# Patient Record
Sex: Male | Born: 1945
Health system: Southern US, Community
[De-identification: ages and names within clinical notes are randomized; demographics above are authoritative.]

## PROBLEM LIST (undated history)

## (undated) DIAGNOSIS — K449 Diaphragmatic hernia without obstruction or gangrene: Secondary | ICD-10-CM

## (undated) DIAGNOSIS — Z86010 Personal history of colonic polyps: Secondary | ICD-10-CM

## (undated) DIAGNOSIS — R319 Hematuria, unspecified: Secondary | ICD-10-CM

## (undated) DIAGNOSIS — H269 Unspecified cataract: Secondary | ICD-10-CM

## (undated) DIAGNOSIS — M1990 Unspecified osteoarthritis, unspecified site: Secondary | ICD-10-CM

## (undated) DIAGNOSIS — M4802 Spinal stenosis, cervical region: Secondary | ICD-10-CM

## (undated) HISTORY — PX: OTHER SURGICAL HISTORY: SHX169

## (undated) HISTORY — PX: COLONOSCOPY: SHX174

---

## 1980-02-28 HISTORY — PX: BACK SURGERY: SHX140

## 2009-01-18 LAB — HM COLONOSCOPY

## 2010-02-27 HISTORY — PX: FINGER SURGERY: SHX640

## 2011-01-31 ENCOUNTER — Encounter (HOSPITAL_COMMUNITY): Payer: Self-pay | Admitting: Pharmacy Technician

## 2011-02-10 ENCOUNTER — Encounter (HOSPITAL_COMMUNITY): Payer: Self-pay

## 2011-02-10 ENCOUNTER — Encounter (HOSPITAL_COMMUNITY)
Admission: RE | Admit: 2011-02-10 | Discharge: 2011-02-10 | Disposition: A | Discharge status: Home / Self Care | Payer: Medicare Other | Source: Ambulatory Visit | Attending: Neurological Surgery | Admitting: Neurological Surgery

## 2011-02-10 HISTORY — DX: Hematuria, unspecified: R31.9

## 2011-02-10 HISTORY — DX: Personal history of colonic polyps: Z86.010

## 2011-02-10 HISTORY — DX: Diaphragmatic hernia without obstruction or gangrene: K44.9

## 2011-02-10 HISTORY — DX: Spinal stenosis, cervical region: M48.02

## 2011-02-10 HISTORY — DX: Unspecified osteoarthritis, unspecified site: M19.90

## 2011-02-10 HISTORY — DX: Unspecified cataract: H26.9

## 2011-02-10 LAB — BASIC METABOLIC PANEL
BUN: 12 mg/dL (ref 6–23)
CO2: 30 mEq/L (ref 19–32)
Calcium: 9.4 mg/dL (ref 8.4–10.5)
Chloride: 106 mEq/L (ref 96–112)
Creatinine, Ser: 0.98 mg/dL (ref 0.50–1.35)
GFR calc Af Amer: >90 mL/min (ref >90)
GFR calc non Af Amer: 83 mL/min — ABNORMAL LOW (ref >90)
Glucose, Bld: 140 mg/dL — ABNORMAL HIGH (ref 70–99)
Potassium: 4.5 mEq/L (ref 3.5–5.1)
Sodium: 142 mEq/L (ref 135–145)

## 2011-02-10 LAB — CBC
HCT: 46.8 % (ref 39.0–52.0)
Hemoglobin: 16 g/dL (ref 13.0–17.0)
MCH: 31.6 pg (ref 26.0–34.0)
MCHC: 34.2 g/dL (ref 30.0–36.0)
MCV: 92.5 fL (ref 78.0–100.0)
Platelets: 201 10*3/uL (ref 150–400)
RBC: 5.06 MIL/uL (ref 4.22–5.81)
RDW: 13 % (ref 11.5–15.5)
WBC: 9.1 10*3/uL (ref 4.0–10.5)

## 2011-02-10 LAB — SURGICAL PCR SCREEN
MRSA, PCR: NEGATIVE
Staphylococcus aureus: NEGATIVE

## 2011-02-10 LAB — DIFFERENTIAL
Basophils Absolute: 0.1 10*3/uL (ref 0.0–0.1)
Basophils Relative: 1 % (ref 0–1)
Eosinophils Absolute: 0.3 10*3/uL (ref 0.0–0.7)
Eosinophils Relative: 3 % (ref 0–5)
Lymphocytes Relative: 26 % (ref 12–46)
Lymphs Abs: 2.3 10*3/uL (ref 0.7–4.0)
Monocytes Absolute: 0.6 10*3/uL (ref 0.1–1.0)
Monocytes Relative: 7 % (ref 3–12)
Neutro Abs: 5.8 10*3/uL (ref 1.7–7.7)
Neutrophils Relative %: 64 % (ref 43–77)

## 2011-02-10 LAB — APTT: aPTT: 25 seconds (ref 24–37)

## 2011-02-10 LAB — PROTIME-INR
INR: 0.99 (ref 0.00–1.49)
Prothrombin Time: 13.3 seconds (ref 11.6–15.2)

## 2011-02-10 NOTE — Progress Notes (Signed)
Also requested CXR from [facility]

## 2011-02-10 NOTE — Progress Notes (Signed)
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

## 2011-02-10 NOTE — Pre-Procedure Instructions (Signed)
20 Randy Jensen  02/10/2011   Your procedure is scheduled on: Thurs,Dec 20 @ 0730 Report to [facility] at 0530 AM.  Call this number if you have problems the morning of surgery: [phone]   Remember:   Do not eat food:After Midnight.  May have clear liquids: up to 4 Hours before arrival.(until 1:30am)  Clear liquids include soda, tea, black coffee, apple or grape juice, broth.  Take these medicines the morning of surgery with A SIP OF WATER: Pantoprazole   Do not wear jewelry, make-up or nail polish.  Do not wear lotions, powders, or perfumes. You may wear deodorant.  Do not shave 48 hours prior to surgery.  Do not bring valuables to the hospital.  Contacts, dentures or bridgework may not be worn into surgery.  Leave suitcase in the car. After surgery it may be brought to your room.  For patients admitted to the hospital, checkout time is 11:00 AM the day of discharge.   Patients discharged the day of surgery will not be allowed to drive home.  Name and phone number of your driver:   Special Instructions: CHG Shower Use Special Wash: 1/2 bottle night before surgery and 1/2 bottle morning of surgery.   Please read over the following fact sheets that you were given: Pain Booklet, Coughing and Deep Breathing, MRSA Information and Surgical Site Infection Prevention

## 2011-02-10 NOTE — Progress Notes (Signed)
Spoke with Dr.Allison Sniders office,ekg and cxr was done 07/2009;no record of stress/echo done  Called over to [facility] to see if they have a copy of these reports;stress test was found and to be sent;but no echo was done

## 2011-02-15 MED ORDER — SODIUM CHLORIDE 0.9 % IV SOLN
500.0000 mg | Freq: Once | INTRAVENOUS | Status: AC
  Administered 2011-02-16: 500 mg via INTRAVENOUS
  Filled 2011-02-15: qty 500

## 2011-02-16 ENCOUNTER — Inpatient Hospital Stay (HOSPITAL_COMMUNITY): Payer: Medicare Other

## 2011-02-16 ENCOUNTER — Encounter (HOSPITAL_COMMUNITY): Payer: Self-pay | Admitting: Anesthesiology

## 2011-02-16 ENCOUNTER — Inpatient Hospital Stay (HOSPITAL_COMMUNITY): Payer: Medicare Other | Admitting: Anesthesiology

## 2011-02-16 ENCOUNTER — Other Ambulatory Visit: Payer: Self-pay

## 2011-02-16 ENCOUNTER — Encounter (HOSPITAL_COMMUNITY): Payer: Self-pay | Admitting: Neurological Surgery

## 2011-02-16 ENCOUNTER — Inpatient Hospital Stay (HOSPITAL_COMMUNITY)
Admission: RE | Admit: 2011-02-16 | Discharge: 2011-02-17 | DRG: 473 | Disposition: A | Discharge status: Home / Self Care | Payer: Medicare Other | Source: Ambulatory Visit | Attending: Neurological Surgery | Admitting: Neurological Surgery

## 2011-02-16 ENCOUNTER — Encounter (HOSPITAL_COMMUNITY)
Admission: RE | Disposition: A | Discharge status: Home / Self Care | Payer: Self-pay | Source: Ambulatory Visit | Attending: Neurological Surgery

## 2011-02-16 DIAGNOSIS — Z860100 Personal history of colon polyps, unspecified: Secondary | ICD-10-CM

## 2011-02-16 DIAGNOSIS — E119 Type 2 diabetes mellitus without complications: Secondary | ICD-10-CM | POA: Diagnosis present

## 2011-02-16 DIAGNOSIS — Z87891 Personal history of nicotine dependence: Secondary | ICD-10-CM

## 2011-02-16 DIAGNOSIS — K449 Diaphragmatic hernia without obstruction or gangrene: Secondary | ICD-10-CM | POA: Diagnosis present

## 2011-02-16 DIAGNOSIS — M4322 Fusion of spine, cervical region: Secondary | ICD-10-CM

## 2011-02-16 DIAGNOSIS — M4802 Spinal stenosis, cervical region: Principal | ICD-10-CM | POA: Diagnosis present

## 2011-02-16 HISTORY — PX: ANTERIOR CERVICAL DECOMP/DISCECTOMY FUSION: SHX1161

## 2011-02-16 LAB — GLUCOSE, CAPILLARY
Glucose-Capillary: 143 mg/dL — ABNORMAL HIGH (ref 70–99)
Glucose-Capillary: 169 mg/dL — ABNORMAL HIGH (ref 70–99)
Glucose-Capillary: 166 mg/dL — ABNORMAL HIGH (ref 70–99)
Glucose-Capillary: 170 mg/dL — ABNORMAL HIGH (ref 70–99)

## 2011-02-16 SURGERY — ANTERIOR CERVICAL DECOMPRESSION/DISCECTOMY FUSION 3 LEVELS
Anesthesia: General | Site: Spine Cervical | Wound class: Clean

## 2011-02-16 MED ORDER — ONDANSETRON HCL 4 MG/2ML IJ SOLN: 4.0000 mg | INTRAMUSCULAR | Status: DC | PRN

## 2011-02-16 MED ORDER — PROMETHAZINE HCL 25 MG/ML IJ SOLN: 6.2500 mg | INTRAMUSCULAR | Status: DC | PRN

## 2011-02-16 MED ORDER — METHOCARBAMOL 100 MG/ML IJ SOLN
500.0000 mg | Freq: Four times a day (QID) | INTRAVENOUS | Status: DC | PRN
  Filled 2011-02-16: qty 5

## 2011-02-16 MED ORDER — PROPOFOL 10 MG/ML IV EMUL
INTRAVENOUS | Status: DC | PRN
  Administered 2011-02-16: 170 mg via INTRAVENOUS

## 2011-02-16 MED ORDER — SODIUM CHLORIDE 0.9 % IJ SOLN: 3.0000 mL | INTRAMUSCULAR | Status: DC | PRN

## 2011-02-16 MED ORDER — ACETAMINOPHEN 650 MG RE SUPP: 650.0000 mg | RECTAL | Status: DC | PRN

## 2011-02-16 MED ORDER — THROMBIN 20000 UNITS EX KIT
PACK | CUTANEOUS | Status: DC | PRN
  Administered 2011-02-16: 20000 [IU] via TOPICAL

## 2011-02-16 MED ORDER — SODIUM CHLORIDE 0.9 % IV SOLN
INTRAVENOUS | Status: AC
  Filled 2011-02-16: qty 500

## 2011-02-16 MED ORDER — INSULIN ASPART 100 UNIT/ML ~~LOC~~ SOLN
0.0000 [IU] | Freq: Three times a day (TID) | SUBCUTANEOUS | Status: DC
  Administered 2011-02-16: 3 [IU] via SUBCUTANEOUS
  Administered 2011-02-17 (×2): 2 [IU] via SUBCUTANEOUS
  Filled 2011-02-16: qty 3

## 2011-02-16 MED ORDER — HYDROMORPHONE HCL PF 1 MG/ML IJ SOLN: 0.5000 mg | INTRAMUSCULAR | Status: DC | PRN

## 2011-02-16 MED ORDER — NEOSTIGMINE METHYLSULFATE 1 MG/ML IJ SOLN
INTRAMUSCULAR | Status: DC | PRN
  Administered 2011-02-16: 3 mg via INTRAVENOUS

## 2011-02-16 MED ORDER — 0.9 % SODIUM CHLORIDE (POUR BTL) OPTIME
TOPICAL | Status: DC | PRN
  Administered 2011-02-16: 1000 mL

## 2011-02-16 MED ORDER — VANCOMYCIN HCL 500 MG IV SOLR
500.0000 mg | Freq: Once | INTRAVENOUS | Status: AC
  Administered 2011-02-16: 500 mg via INTRAVENOUS
  Filled 2011-02-16: qty 500

## 2011-02-16 MED ORDER — METHOCARBAMOL 500 MG PO TABS
500.0000 mg | ORAL_TABLET | Freq: Four times a day (QID) | ORAL | Status: DC | PRN
  Administered 2011-02-16: 500 mg via ORAL
  Filled 2011-02-16: qty 1

## 2011-02-16 MED ORDER — HEMOSTATIC AGENTS (NO CHARGE) OPTIME
TOPICAL | Status: DC | PRN
  Administered 2011-02-16: 1 via TOPICAL

## 2011-02-16 MED ORDER — DEXAMETHASONE 4 MG PO TABS
4.0000 mg | ORAL_TABLET | Freq: Four times a day (QID) | ORAL | Status: DC
  Administered 2011-02-16 – 2011-02-17 (×4): 4 mg via ORAL
  Filled 2011-02-16 (×8): qty 1

## 2011-02-16 MED ORDER — LACTATED RINGERS IV SOLN
INTRAVENOUS | Status: DC | PRN
  Administered 2011-02-16 (×3): via INTRAVENOUS

## 2011-02-16 MED ORDER — MIDAZOLAM HCL 5 MG/5ML IJ SOLN
INTRAMUSCULAR | Status: DC | PRN
  Administered 2011-02-16: 2 mg via INTRAVENOUS

## 2011-02-16 MED ORDER — THROMBIN 5000 UNITS EX KIT
PACK | OROMUCOSAL | Status: DC | PRN
  Administered 2011-02-16: 09:00:00 via TOPICAL

## 2011-02-16 MED ORDER — MORPHINE SULFATE 2 MG/ML IJ SOLN: 0.0500 mg/kg | INTRAMUSCULAR | Status: DC | PRN

## 2011-02-16 MED ORDER — PHENOL 1.4 % MT LIQD: 1.0000 | OROMUCOSAL | Status: DC | PRN

## 2011-02-16 MED ORDER — BACITRACIN 50000 UNITS IM SOLR
INTRAMUSCULAR | Status: AC
  Filled 2011-02-16: qty 50000

## 2011-02-16 MED ORDER — PANTOPRAZOLE SODIUM 40 MG PO TBEC
40.0000 mg | DELAYED_RELEASE_TABLET | Freq: Every day | ORAL | Status: DC
  Filled 2011-02-16: qty 1

## 2011-02-16 MED ORDER — ACETAMINOPHEN 325 MG PO TABS: 650.0000 mg | ORAL_TABLET | ORAL | Status: DC | PRN

## 2011-02-16 MED ORDER — POTASSIUM CHLORIDE IN NACL 20-0.9 MEQ/L-% IV SOLN
INTRAVENOUS | Status: DC
  Filled 2011-02-16 (×3): qty 1000

## 2011-02-16 MED ORDER — CEFAZOLIN SODIUM 1-5 GM-% IV SOLN: 1.0000 g | Freq: Three times a day (TID) | INTRAVENOUS | Status: DC

## 2011-02-16 MED ORDER — DEXAMETHASONE SODIUM PHOSPHATE 4 MG/ML IJ SOLN
4.0000 mg | Freq: Four times a day (QID) | INTRAMUSCULAR | Status: DC
  Administered 2011-02-16: 4 mg via INTRAVENOUS
  Filled 2011-02-16 (×5): qty 1

## 2011-02-16 MED ORDER — ROCURONIUM BROMIDE 100 MG/10ML IV SOLN
INTRAVENOUS | Status: DC | PRN
  Administered 2011-02-16: 50 mg via INTRAVENOUS
  Administered 2011-02-16 (×3): 10 mg via INTRAVENOUS

## 2011-02-16 MED ORDER — MEPERIDINE HCL 25 MG/ML IJ SOLN: 6.2500 mg | INTRAMUSCULAR | Status: DC | PRN

## 2011-02-16 MED ORDER — ONDANSETRON HCL 4 MG/2ML IJ SOLN
INTRAMUSCULAR | Status: DC | PRN
  Administered 2011-02-16: 4 mg via INTRAVENOUS

## 2011-02-16 MED ORDER — BUPIVACAINE HCL (PF) 0.25 % IJ SOLN
INTRAMUSCULAR | Status: DC | PRN
  Administered 2011-02-16: 7 mL

## 2011-02-16 MED ORDER — SODIUM CHLORIDE 0.9 % IJ SOLN
3.0000 mL | Freq: Two times a day (BID) | INTRAMUSCULAR | Status: DC
  Administered 2011-02-16 – 2011-02-17 (×2): 3 mL via INTRAVENOUS

## 2011-02-16 MED ORDER — MENTHOL 3 MG MT LOZG: 1.0000 | LOZENGE | OROMUCOSAL | Status: DC | PRN

## 2011-02-16 MED ORDER — HYDROMORPHONE HCL PF 1 MG/ML IJ SOLN: 0.2500 mg | INTRAMUSCULAR | Status: DC | PRN

## 2011-02-16 MED ORDER — THROMBIN 5000 UNITS EX KIT
PACK | OROMUCOSAL | Status: DC | PRN
  Administered 2011-02-16: 10:00:00 via TOPICAL

## 2011-02-16 MED ORDER — INSULIN ASPART 100 UNIT/ML ~~LOC~~ SOLN: 0.0000 [IU] | Freq: Every day | SUBCUTANEOUS | Status: DC

## 2011-02-16 MED ORDER — METHOCARBAMOL 100 MG/ML IJ SOLN
500.0000 mg | INTRAVENOUS | Status: DC
  Filled 2011-02-16: qty 5

## 2011-02-16 MED ORDER — SODIUM CHLORIDE 0.9 % IR SOLN
Status: DC | PRN
  Administered 2011-02-16: 09:00:00

## 2011-02-16 MED ORDER — GLYCOPYRROLATE 0.2 MG/ML IJ SOLN
INTRAMUSCULAR | Status: DC | PRN
  Administered 2011-02-16: .6 mg via INTRAVENOUS

## 2011-02-16 MED ORDER — OXYCODONE-ACETAMINOPHEN 5-325 MG PO TABS: 1.0000 | ORAL_TABLET | ORAL | Status: DC | PRN

## 2011-02-16 MED ORDER — FENTANYL CITRATE 0.05 MG/ML IJ SOLN
INTRAMUSCULAR | Status: DC | PRN
  Administered 2011-02-16: 50 ug via INTRAVENOUS
  Administered 2011-02-16: 100 ug via INTRAVENOUS
  Administered 2011-02-16: 150 ug via INTRAVENOUS
  Administered 2011-02-16: 100 ug via INTRAVENOUS

## 2011-02-16 MED ORDER — DEXAMETHASONE SODIUM PHOSPHATE 10 MG/ML IJ SOLN
10.0000 mg | Freq: Once | INTRAMUSCULAR | Status: AC
  Administered 2011-02-16: 10 mg via INTRAVENOUS

## 2011-02-16 MED ORDER — DEXAMETHASONE SODIUM PHOSPHATE 10 MG/ML IJ SOLN
INTRAMUSCULAR | Status: AC
  Filled 2011-02-16: qty 1

## 2011-02-16 MED ORDER — THROMBIN 5000 UNITS EX KIT
PACK | CUTANEOUS | Status: DC | PRN
  Administered 2011-02-16 (×2): 5000 [IU] via TOPICAL

## 2011-02-16 SURGICAL SUPPLY — 61 items
BAG DECANTER FOR FLEXI CONT (MISCELLANEOUS) ×2 IMPLANT
BENZOIN TINCTURE PRP APPL 2/3 (GAUZE/BANDAGES/DRESSINGS) ×2 IMPLANT
BUR MATCHSTICK NEURO 3.0 LAGG (BURR) ×2 IMPLANT
CANISTER SUCTION 2500CC (MISCELLANEOUS) ×2 IMPLANT
CLOTH BEACON ORANGE TIMEOUT ST (SAFETY) ×2 IMPLANT
CONT SPEC 4OZ CLIKSEAL STRL BL (MISCELLANEOUS) ×2 IMPLANT
DRAIN SNY WOU 7FLT (WOUND CARE) ×2 IMPLANT
DRAPE C-ARM 42X72 X-RAY (DRAPES) ×4 IMPLANT
DRAPE LAPAROTOMY 100X72 PEDS (DRAPES) ×2 IMPLANT
DRAPE MICROSCOPE LEICA (MISCELLANEOUS) ×2 IMPLANT
DRAPE MICROSCOPE ZEISS OPMI (DRAPES) IMPLANT
DRAPE POUCH INSTRU U-SHP 10X18 (DRAPES) ×2 IMPLANT
DRESSING TELFA 8X3 (GAUZE/BANDAGES/DRESSINGS) ×2 IMPLANT
DRILL BIT HELIX 13MM (BIT) ×2 IMPLANT
DRSG OPSITE 4X5.5 SM (GAUZE/BANDAGES/DRESSINGS) ×2 IMPLANT
DURAPREP 6ML APPLICATOR 50/CS (WOUND CARE) ×2 IMPLANT
ELECT COATED BLADE 2.86 ST (ELECTRODE) ×2 IMPLANT
ELECT REM PT RETURN 9FT ADLT (ELECTROSURGICAL) ×2
ELECTRODE REM PT RTRN 9FT ADLT (ELECTROSURGICAL) ×1 IMPLANT
EVACUATOR SILICONE 100CC (DRAIN) ×2 IMPLANT
GAUZE SPONGE 4X4 16PLY XRAY LF (GAUZE/BANDAGES/DRESSINGS) ×2 IMPLANT
GLOVE BIO SURGEON STRL SZ8 (GLOVE) ×4 IMPLANT
GLOVE BIOGEL PI IND STRL 7.0 (GLOVE) ×1 IMPLANT
GLOVE BIOGEL PI IND STRL 7.5 (GLOVE) ×1 IMPLANT
GLOVE BIOGEL PI INDICATOR 7.0 (GLOVE) ×1
GLOVE BIOGEL PI INDICATOR 7.5 (GLOVE) ×1
GLOVE ECLIPSE 7.5 STRL STRAW (GLOVE) ×4 IMPLANT
GLOVE ECLIPSE 8.0 STRL XLNG CF (GLOVE) ×2 IMPLANT
GLOVE ECLIPSE 8.5 STRL (GLOVE) ×2 IMPLANT
GLOVE EXAM NITRILE MD LF STRL (GLOVE) IMPLANT
GLOVE SURG SS PI 6.5 STRL IVOR (GLOVE) ×6 IMPLANT
GOWN BRE IMP SLV AUR LG STRL (GOWN DISPOSABLE) IMPLANT
GOWN BRE IMP SLV AUR XL STRL (GOWN DISPOSABLE) ×6 IMPLANT
GOWN STRL REIN 2XL LVL4 (GOWN DISPOSABLE) ×2 IMPLANT
HEAD HALTER (SOFTGOODS) ×2 IMPLANT
HEMOSTAT POWDER KIT SURGIFOAM (HEMOSTASIS) ×4 IMPLANT
IMPLT CONSTRUX LORDO 15X12X8MM (Orthopedic Implant) ×2 IMPLANT
IMPLT CONSTRUX LORDO 15X12X9MM (Orthopedic Implant) ×4 IMPLANT
KIT BASIN OR (CUSTOM PROCEDURE TRAY) ×2 IMPLANT
KIT ROOM TURNOVER OR (KITS) ×2 IMPLANT
NEEDLE HYPO 25X1 1.5 SAFETY (NEEDLE) ×2 IMPLANT
NEEDLE SPNL 20GX3.5 QUINCKE YW (NEEDLE) ×2 IMPLANT
NS IRRIG 1000ML POUR BTL (IV SOLUTION) ×2 IMPLANT
PACK LAMINECTOMY NEURO (CUSTOM PROCEDURE TRAY) ×2 IMPLANT
PAD ARMBOARD 7.5X6 YLW CONV (MISCELLANEOUS) ×6 IMPLANT
PUTTY BONE DBX 2.5 MIS (Bone Implant) ×2 IMPLANT
RUBBERBAND STERILE (MISCELLANEOUS) ×4 IMPLANT
SCREW FIXED SELF TAP 4.0X13MM (Screw) ×6 IMPLANT
SCREW FIXED SELF TAP 4X15 (Screw) ×10 IMPLANT
SCREW SELF TAP 13MM VARIABLE (Screw) ×4 IMPLANT
SPONGE INTESTINAL PEANUT (DISPOSABLE) ×2 IMPLANT
SPONGE SURGIFOAM ABS GEL 100 (HEMOSTASIS) ×2 IMPLANT
STRIP CLOSURE SKIN 1/2X4 (GAUZE/BANDAGES/DRESSINGS) ×2 IMPLANT
SUT VIC AB 3-0 SH 8-18 (SUTURE) ×4 IMPLANT
SYR 20ML ECCENTRIC (SYRINGE) ×2 IMPLANT
TOWEL OR 17X24 6PK STRL BLUE (TOWEL DISPOSABLE) ×2 IMPLANT
TOWEL OR 17X26 10 PK STRL BLUE (TOWEL DISPOSABLE) ×2 IMPLANT
TRAP SPECIMEN MUCOUS 40CC (MISCELLANEOUS) ×2 IMPLANT
TUBE CONNECTING 12X1/4 (SUCTIONS) ×2 IMPLANT
WATER STERILE IRR 1000ML POUR (IV SOLUTION) ×2 IMPLANT
helix plate 62mm ×2 IMPLANT

## 2011-02-16 NOTE — OR Nursing (Signed)
Care assumed

## 2011-02-16 NOTE — Anesthesia Preprocedure Evaluation (Addendum)
Anesthesia Evaluation  Patient identified by MRN, date of birth, ID band Patient awake    Reviewed: Allergy & Precautions, H&P , NPO status , Patient's Chart, lab work & pertinent test results, reviewed documented beta blocker date and time   Airway Mallampati: II  Neck ROM: Full    Dental No notable dental hx. (+) Teeth Intact and Dental Advisory Given   Pulmonary neg pulmonary ROS,  clear to auscultation  Pulmonary exam normal       Cardiovascular neg cardio ROS Regular Normal    Neuro/Psych  Neuromuscular disease    GI/Hepatic hiatal hernia,   Endo/Other  Diabetes mellitus-  Renal/GU      Musculoskeletal   Abdominal Normal abdominal exam  (+)   Peds  Hematology   Anesthesia Other Findings   Reproductive/Obstetrics                         Anesthesia Physical Anesthesia Plan  ASA: III  Anesthesia Plan: General   Post-op Pain Management:    Induction:   Airway Management Planned: Oral ETT  Additional Equipment:   Intra-op Plan:   Post-operative Plan: Extubation in OR  Informed Consent: I have reviewed the patients History and Physical, chart, labs and discussed the procedure including the risks, benefits and alternatives for the proposed anesthesia with the patient or authorized representative who has indicated his/her understanding and acceptance.     Plan Discussed with: CRNA  Anesthesia Plan Comments:         Anesthesia Quick Evaluation

## 2011-02-16 NOTE — Anesthesia Procedure Notes (Signed)
Procedure Name: Intubation Date/Time: 02/16/2011 7:50 AM Performed by: Gwenyth Allegra Pre-anesthesia Checklist: Patient identified, Timeout performed, Emergency Drugs available, Suction available and Patient being monitored Patient Re-evaluated:Patient Re-evaluated prior to inductionOxygen Delivery Method: Circle System Utilized Preoxygenation: Pre-oxygenation with 100% oxygen Intubation Type: IV induction Ventilation: Mask ventilation without difficulty and Oral airway inserted - appropriate to patient size Grade View: Grade II Tube type: Oral Tube size: 7.5 mm Number of attempts: 1 Airway Equipment and Method: video-laryngoscopy Placement Confirmation: ETT inserted through vocal cords under direct vision,  positive ETCO2,  CO2 detector and breath sounds checked- equal and bilateral Secured at: 23 cm Tube secured with: Tape Dental Injury: Teeth and Oropharynx as per pre-operative assessment  Difficulty Due To: Difficult Airway- due to reduced neck mobility

## 2011-02-16 NOTE — Progress Notes (Signed)
Did not receive cxr and ekg from  [facility].   Did dos.

## 2011-02-16 NOTE — Transfer of Care (Signed)
Immediate Anesthesia Transfer of Care Note  Patient: Randy Jensen  Procedure(s) Performed:  ANTERIOR CERVICAL DECOMPRESSION/DISCECTOMY FUSION 3 LEVELS - Cervical Three-four,Cervical Four-Five,Cervical Five-Six,Anterior Cervical Decompression and Fusion with Nuvasive Plating  Patient Location: PACU  Anesthesia Type: General  Level of Consciousness: awake, alert  and oriented  Airway & Oxygen Therapy: Patient Spontanous Breathing and Patient connected to nasal cannula oxygen  Post-op Assessment: Report given to PACU RN, Post -op Vital signs reviewed and stable and Patient moving all extremities X 4  Post vital signs: Reviewed and stable  Complications: No apparent anesthesia complications

## 2011-02-16 NOTE — Op Note (Signed)
02/16/2011  11:54 AM  PATIENT:  Randy Jensen  65 y.o. male  PRE-OPERATIVE DIAGNOSIS:  Spondylosis with cervical spinal stenosis C3-4 C4-5 C5-6, right shoulder weakness  POST-OPERATIVE DIAGNOSIS:  Same  PROCEDURE:  1. Decompressive anterior cervical discectomy C3-4 C4-5 C5-6, with partial corpectomy of C4 2. Anterior cervical arthrodesis C3-4 C4-5 C5-6 utilizing 9 mm peek interbody cages packed with morcellized autograft and allograft, 3. Anterior cervical plating C3-C6 utilizing the Nuvasive helix translational plate  SURGEON:  Marikay Alar, MD  ASSISTANTS: Dr Jordan Likes  ANESTHESIA:   General  EBL: 50 ml  Total I/O In: 2000 [I.V.:2000] Out: 625 [Urine:525; Blood:100]  BLOOD ADMINISTERED:none  DRAINS: JP   SPECIMEN:  No Specimen  INDICATION FOR PROCEDURE: This 65 year old gentleman who presented with severe spinal stenosis of the cervical spine with slowly proggressive right shoulder weakness. I recommended a 3 level ACDF with plating. Patient understood the risks, benefits, and alternatives and potential outcomes and wished to proceed.  PROCEDURE DETAILS: Patient was brought to the operating room placed under general endotracheal anesthesia. Patient was placed in the supine position on the operating room table. The neck was prepped with Duraprep and draped in a sterile fashion.   Three cc of local anesthesia was injected and a transverse incision was made on the right side of the neck.  Dissection was carried down thru the subcutaneous tissue and the platysma was  elevated, opened, and undermined with Metzenbaum scissors.  Dissection was then carried out thru an avascular plane leaving the sternocleidomastoid carotid artery and jugular vein laterally and the trachea and esophagus medially. The ventral aspect of the vertebral column was identified and a localizing x-ray was taken. The C3-4, C4-5, C5-6 levels were identified. The longus colli muscles were then elevated and the retractor was  placed. The annulus was incised and the disc space entered at each level. Discectomy was performed with micro-curettes and pituitary rongeurs. Anterior osteophytes were removed. The disc spaces were very degenerated and collapsed. I then used the high-speed drill to drill the endplates down to the level of the posterior longitudinal ligament. The drill shavings were saved in a mucous trap for later arthrodesis. I drilled to a height of 9 mm at each level, and removed at least half of the C4 vertebral body, performing a partial corpectomy in order to adequately undercut the C4 vertebral body and decompress behind. The operating microscope was draped and brought into the field provided additional magnification, illumination and visualization. Discectomy was continued posteriorly thru the disc space. Posterior longitudinal ligament was opened with a nerve hook, and then removed along with disc herniation and osteophytes, decompressing the spinal canal and thecal sac at each level. We then continued to remove osteophytic overgrowth and disc material decompressing the neural foramina and exiting nerve roots bilaterally. The scope was angled up and down to help decompress and undercut the vertebral bodies. Once the decompression was completed we could pass a nerve hook circumferentially to assure adequate decompression in the midline and in the neural foramina. I could also see the spinal cord pulsatile through the dura. So by both visualization and palpation we felt we had an adequate decompression of the neural elements. We then measured the height of the intravertebral disc space and selected a 9 millimeter Peek interbody cage packed with autograft and morcellized allograft. It was then gently positioned in the intravertebral disc spaces and countersunk. I then used a Nuvasive translational plate and placed fixed angle screws into the vertebral bodies and locked  them into position. The wound was irrigated with  bacitracin solution, checked for hemostasis which was established and confirmed. A 7 flat JP drain was placed.  Once meticulous hemostasis was achieved, we then proceeded with closure. The platysma was closed with interrupted 3-0 undyed Vicryl suture, the subcuticular layer was closed with interrupted 3-0 undyed Vicryl suture. The skin edges were approximated with steristrips. The drapes were removed. A sterile dressing was applied. The patient was then awakened from general anesthesia and transferred to the recovery room in stable condition. At the end of the procedure all sponge, needle and instrument counts were correct.   PLAN OF CARE: Admit to inpatient   PATIENT DISPOSITION:  PACU - hemodynamically stable.   Delay start of Pharmacological VTE agent (>24hrs) due to surgical blood loss or risk of bleeding:  yes

## 2011-02-16 NOTE — Anesthesia Postprocedure Evaluation (Signed)
  Anesthesia Post-op Note  Patient: Randy Jensen  Procedure(s) Performed:  ANTERIOR CERVICAL DECOMPRESSION/DISCECTOMY FUSION 3 LEVELS - Cervical Three-four,Cervical Four-Five,Cervical Five-Six,Anterior Cervical Decompression and Fusion with Nuvasive Plating  Patient Location: PACU  Anesthesia Type: General  Level of Consciousness: awake  Airway and Oxygen Therapy: Patient Spontanous Breathing  Post-op Pain: mild  Post-op Assessment: Post-op Vital signs reviewed  Post-op Vital Signs: stable  Complications: No apparent anesthesia complications

## 2011-02-16 NOTE — H&P (Signed)
Subjective:   Patient is a 65 y.o. male admitted for ACDF C3-4 C4-5 C5-6. The patient first presented to me with complaints of loss of strength of the arm(s). Onset of symptoms was several years ago. The pain is described as dull and occurs intermittently. The pain is rated mild, and is located at the without radiation.. The symptoms have been progressive. Symptoms are exacerbated by extending head backwards, and are relieved by rest. History positive for upper extremity weakness: right deltoid area. Previous work up includes MRI of cervical spine, results: spinal stenosis.  Past Medical History  Diagnosis Date  . Arthritis   . Cervical spinal stenosis   . Hiatal hernia     pt states that he takes Pantoprazole daily for hiatal hernia but denies GERD  . Hx of colonic polyps   . Blood in urine     occ trace per pt   . Diabetes mellitus     "borderline" doesn't require meds;diet and exercise;last  A1 C 6.2 a month ago  . Cataracts, bilateral     Past Surgical History  Procedure Date  . Back surgery 1982  . Finger surgery 2012    right thumb   . Arm surgery     at age 49;broke arm and had to go to the OR  . Colonoscopy     Allergies  Allergen Reactions  . Niacin And Related Other (See Comments)    Blood pressure drops and passes out  . Penicillins Hives  . Sulfa Antibiotics Other (See Comments)    Trudie Buckler Syndrome    History  Substance Use Topics  . Smoking status: Former Smoker -- 0.2 packs/day for 15 years  . Smokeless tobacco: Not on file   Comment: quit 18yrs ago  . Alcohol Use: No    Family History  Problem Relation Age of Onset  . Anesthesia problems Neg Hx   . Hypotension Neg Hx   . Malignant hyperthermia Neg Hx   . Pseudochol deficiency Neg Hx    Prior to Admission medications   Medication Sig Start Date End Date Taking? Authorizing Provider  pantoprazole (PROTONIX) 40 MG tablet Take 40 mg by mouth daily.     Yes Historical Provider, MD  ibuprofen  (ADVIL,MOTRIN) 800 MG tablet Take 800 mg by mouth daily as needed. For pain     Historical Provider, MD     Review of Systems  Positive ROS: neg  All other systems have been reviewed and were otherwise negative with the exception of those mentioned in the HPI and as above.  Objective: Vital signs in last 24 hours: Temp:  [98.1 F (36.7 C)] 98.1 F (36.7 C) (12/20 0626) Pulse Rate:  [63] 63  (12/20 0626) Resp:  [18] 18  (12/20 0626) BP: (130)/(75) 130/75 mmHg (12/20 0626) SpO2:  [96 %] 96 % (12/20 0626)  General Appearance: Alert, cooperative, no distress, appears stated age Head: Normocephalic, without obvious abnormality, atraumatic Eyes: PERRL, conjunctiva/corneas clear, EOM's intact, fundi benign, both eyes      Ears: Normal TM's and external ear canals, both ears Throat: Lips, mucosa, and tongue normal; teeth and gums normal Neck: Supple, symmetrical, trachea midline, no adenopathy; thyroid: No enlargement/tenderness/nodules; no carotid bruit or JVD Back: Symmetric, no curvature, ROM normal, no CVA tenderness Lungs: Clear to auscultation bilaterally, respirations unlabored Heart: Regular rate and rhythm, S1 and S2 normal, no murmur, rub or gallop Abdomen: Soft, non-tender, bowel sounds active all four quadrants, no masses, no organomegaly Extremities: Extremities normal,  atraumatic, no cyanosis or edema Pulses: 2+ and symmetric all extremities Skin: Skin color, texture, turgor normal, no rashes or lesions  NEUROLOGIC:  Mental status: Alert and oriented x4, no aphasia, good attention span, fund of knowledge and memory  Motor Exam - grossly normal Sensory Exam - grossly normal Reflexes: nl Coordination - grossly normal Gait - grossly normal Balance - grossly normal Cranial Nerves: I: smell Not tested  II: visual acuity  OS: nl    OD: nl  II: visual fields Full to confrontation  II: pupils Equal, round, reactive to light  III,VII: ptosis None  III,IV,VI: extraocular  muscles  Full ROM  V: mastication Normal  V: facial light touch sensation  Normal  V,VII: corneal reflex  Present  VII: facial muscle function - upper  Normal  VII: facial muscle function - lower Normal  VIII: hearing Not tested  IX: soft palate elevation  Normal  IX,X: gag reflex Present  XI: trapezius strength  5/5  XI: sternocleidomastoid strength 5/5  XI: neck flexion strength  5/5  XII: tongue strength  Normal    Data Review Lab Results  Component Value Date   WBC 9.1 02/10/2011   HGB 16.0 02/10/2011   HCT 46.8 02/10/2011   MCV 92.5 02/10/2011   PLT 201 02/10/2011   Lab Results  Component Value Date   NA 142 02/10/2011   K 4.5 02/10/2011   CL 106 02/10/2011   CO2 30 02/10/2011   BUN 12 02/10/2011   CREATININE 0.98 02/10/2011   GLUCOSE 140* 02/10/2011   Lab Results  Component Value Date   INR 0.99 02/10/2011    Assessment:   Cervical neck pain with herniated nucleus pulposus/ spondylosis/ stenosis at C3-4, C4-5, C5-6. Patient has failed conservative therapy. Plan ACDF.  Plan:   I explained the condition and procedure to the patient and answered any questions.  Patient wishes to proceed with procedure as planned. Understands risks/ benefits/ and expected or typical outcomes.  Aleshka Corney S 02/16/2011 7:25 AM

## 2011-02-16 NOTE — Plan of Care (Signed)
Problem: Consults Goal: Diagnosis - Spinal Surgery Outcome: Completed/Met Date Met:  02/16/11 Cervical Spine Fusion

## 2011-02-16 NOTE — Preoperative (Signed)
Beta Blockers   Reason not to administer Beta Blockers:Not Applicable 

## 2011-02-17 ENCOUNTER — Encounter (HOSPITAL_COMMUNITY): Payer: Self-pay | Admitting: Neurological Surgery

## 2011-02-17 LAB — GLUCOSE, CAPILLARY
Glucose-Capillary: 143 mg/dL — ABNORMAL HIGH (ref 70–99)
Glucose-Capillary: 147 mg/dL — ABNORMAL HIGH (ref 70–99)

## 2011-02-17 LAB — POCT I-STAT GLUCOSE
Glucose, Bld: 130 mg/dL — ABNORMAL HIGH (ref 70–99)
Operator id: 132841

## 2011-02-17 MED ORDER — OXYCODONE-ACETAMINOPHEN 5-325 MG PO TABS: 1.0000 | ORAL_TABLET | ORAL | Status: AC | PRN

## 2011-02-17 NOTE — Discharge Summary (Signed)
Physician Discharge Summary  Patient ID: Randy Jensen MRN: [id number] DOB/AGE: [birth year] 65 y.o.  Admit date: 02/16/2011 Discharge date: 02/17/2011  Admission Diagnoses: cervical stenosis    Discharge Diagnoses: same   Discharged Condition: good  Hospital Course: The patient was admitted on 02/16/2011 and taken to the operating room where the patient underwent ACDF. The patient tolerated the procedure well and was taken to the recovery room and then to the floor in stable condition. The hospital course was routine. There were no complications. The wound remained clean dry and intact. The patient remained afebrile with stable vital signs, and tolerated a regular diet. The patient continued to increase activities, and pain was well controlled with oral pain medications.   Consults: none  Significant Diagnostic Studies:  Results for orders placed during the hospital encounter of 02/16/11  GLUCOSE, CAPILLARY      Component Value Range   Glucose-Capillary 143 (*) 70 - 99 (mg/dL)  GLUCOSE, CAPILLARY      Component Value Range   Glucose-Capillary 169 (*) 70 - 99 (mg/dL)   Comment 1 Notify RN     Comment 2 Documented in Chart    GLUCOSE, CAPILLARY      Component Value Range   Glucose-Capillary 166 (*) 70 - 99 (mg/dL)  GLUCOSE, CAPILLARY      Component Value Range   Glucose-Capillary 170 (*) 70 - 99 (mg/dL)  GLUCOSE, CAPILLARY      Component Value Range   Glucose-Capillary 143 (*) 70 - 99 (mg/dL)  GLUCOSE, CAPILLARY      Component Value Range   Glucose-Capillary 147 (*) 70 - 99 (mg/dL)  POCT I-STAT GLUCOSE      Component Value Range   Operator id [id number]     Glucose, Bld 130 (*) 70 - 99 (mg/dL)    Dg Chest 2 View  91/47/8295  *RADIOLOGY REPORT*  Clinical Data: Nasal congestion.  Preop.  CHEST - 2 VIEW  Comparison: None.  Findings: Trachea is midline.  Heart size normal.  Biapical pleural thickening.  High density along the right hemidiaphragm may be due to a calcified pleural  plaque.  Lungs are otherwise clear.  No pleural fluid.  Degenerative changes are seen in the spine.  IMPRESSION: No acute findings.  Original Report Authenticated By: Reyes Ivan, M.D.   Dg Cervical Spine 1 View  02/16/2011  *RADIOLOGY REPORT*  Clinical Data: C3-C6 fusion.  DG SPINE PORTABLE - 1 VIEW  Comparison: 10/06/2008  Findings: Single intraoperative lateral view of the cervical spine submitted for review after surgery.  This reveals fusion C3-C6 utilizing interbody spacer anterior plate and screws which appear in satisfactory position on this single projection.  IMPRESSION: Fusion C3-C6.  Original Report Authenticated By: Fuller Canada, M.D.   Dg C-arm Gt 120 Min  02/16/2011  CLINICAL DATA: cervical fusion   C-ARM GT 120 MIN  Fluoroscopy was utilized by the requesting physician.  No radiographic  interpretation.      Antibiotics:  Anti-infectives     Start     Dose/Rate Route Frequency Ordered Stop   02/16/11 2000   vancomycin (VANCOCIN) 500 mg in sodium chloride 0.9 % 100 mL IVPB        500 mg 100 mL/hr over 60 Minutes Intravenous  Once 02/16/11 1307 02/16/11 2049   02/16/11 1400   ceFAZolin (ANCEF) IVPB 1 g/50 mL premix  Status:  Discontinued        1 g 100 mL/hr over 30 Minutes Intravenous 3 times per  day 02/16/11 1239 02/16/11 1305   02/16/11 0839   bacitracin 50,000 Units in sodium chloride irrigation 0.9 % 500 mL irrigation  Status:  Discontinued          As needed 02/16/11 0840 02/16/11 1155   02/16/11 0705   bacitracin 91478 UNITS injection     Comments: DAY, DORY: cabinet override         02/16/11 0705 02/16/11 1914   02/16/11 0000   vancomycin (VANCOCIN) 500 mg in sodium chloride 0.9 % 100 mL IVPB     Comments: Pt is allergic to Penicillin,change to Vanc 500mg  IV to OR      500 mg 100 mL/hr over 60 Minutes Intravenous  Once 02/15/11 1441 02/16/11 0730          Discharge Exam: Blood pressure 150/76, pulse 73, temperature 98.5 F (36.9 C), temperature  source Oral, resp. rate 20, SpO2 95.00%. Neurologic: Alert and oriented X 3, normal strength and tone. Normal symmetric reflexes. Normal coordination and gait Motor: r delt weakness. stable  Discharge Medications:   Current Discharge Medication List    START taking these medications   Details  oxyCODONE-acetaminophen (PERCOCET) 5-325 MG per tablet Take 1-2 tablets by mouth every 4 (four) hours as needed. Qty: 80 tablet, Refills: 0      CONTINUE these medications which have NOT CHANGED   Details  pantoprazole (PROTONIX) 40 MG tablet Take 40 mg by mouth daily.        STOP taking these medications     ibuprofen (ADVIL,MOTRIN) 800 MG tablet         Disposition: home   Final Dx: ACDF  Discharge Orders    Future Orders Please Complete By Expires   Diet - low sodium heart healthy      Increase activity slowly      Call MD for:  temperature >100.4      Call MD for:  persistant nausea and vomiting      Call MD for:  severe uncontrolled pain      Call MD for:  redness, tenderness, or signs of infection (pain, swelling, redness, odor or green/yellow discharge around incision site)         Follow-up Information    Follow up with Rayme Bui S in 2 weeks.   Contact information:   [address] [place] [address] [phone]           Signed: Tia Alert 02/17/2011, 12:33 PM

## 2011-03-28 ENCOUNTER — Ambulatory Visit
Admission: RE | Admit: 2011-03-28 | Discharge: 2011-03-28 | Disposition: A | Discharge status: Home / Self Care | Payer: Medicare Other | Source: Ambulatory Visit | Attending: Neurological Surgery | Admitting: Neurological Surgery

## 2011-03-28 ENCOUNTER — Other Ambulatory Visit: Payer: Self-pay | Admitting: Neurological Surgery

## 2011-03-28 DIAGNOSIS — M542 Cervicalgia: Secondary | ICD-10-CM

## 2011-03-28 DIAGNOSIS — M47812 Spondylosis without myelopathy or radiculopathy, cervical region: Secondary | ICD-10-CM

## 2012-09-21 IMAGING — RF DG CERVICAL SPINE 1V
1 series · 1 of 1 positions shown · non-contrast
Comparison: 10/06/2008

CLINICAL DATA: C3-C6 fusion.

DG SPINE PORTABLE - 1 VIEW

[Series 1: run · 1 of 1 slices shown]
[im 1/1]
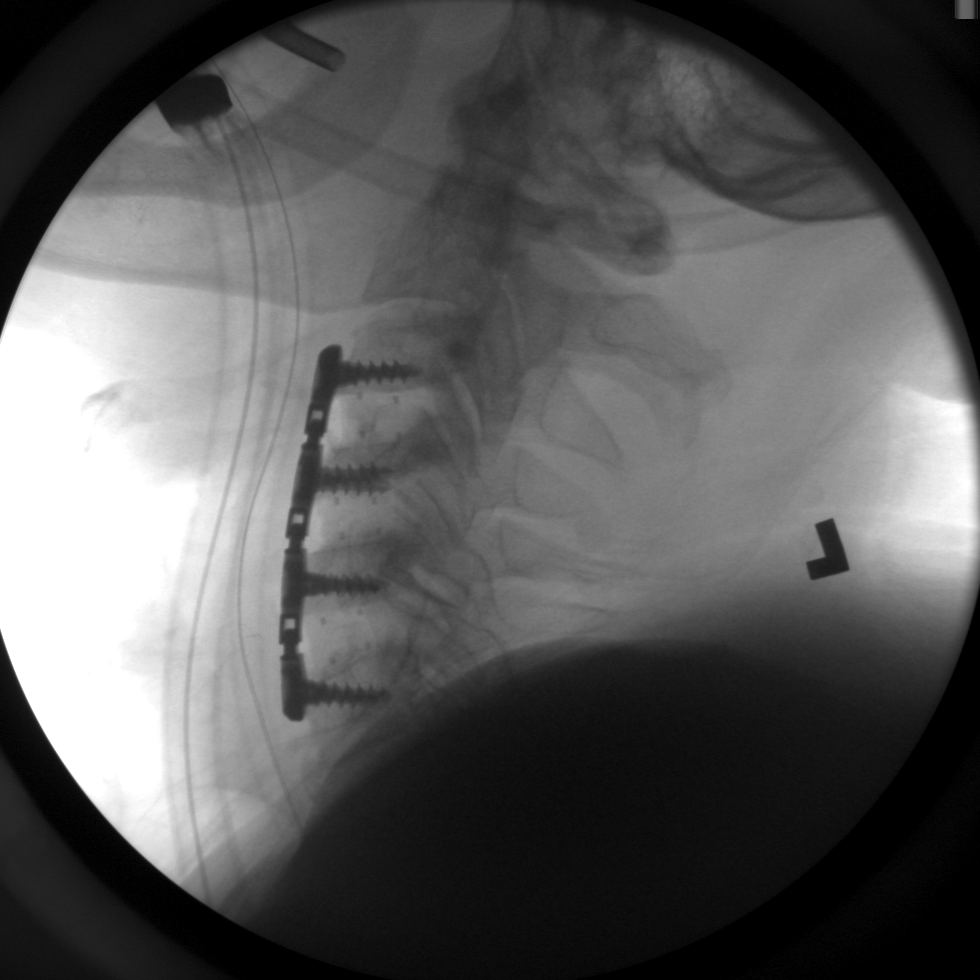

[1 of 1 positions shown; findings below may reference images not displayed]

FINDINGS: Single intraoperative lateral view of the cervical spine
submitted for review after surgery.  This reveals fusion C3-C6
utilizing interbody spacer anterior plate and screws which appear
in satisfactory position on this single projection.
IMPRESSION: Fusion C3-C6.

## 2012-09-21 IMAGING — CR DG CHEST 2V
3 series · 3 of 3 positions shown · non-contrast
Comparison: None.

CLINICAL DATA: Nasal congestion.  Preop.

CHEST - 2 VIEW

[view not recorded (1 of 3)]
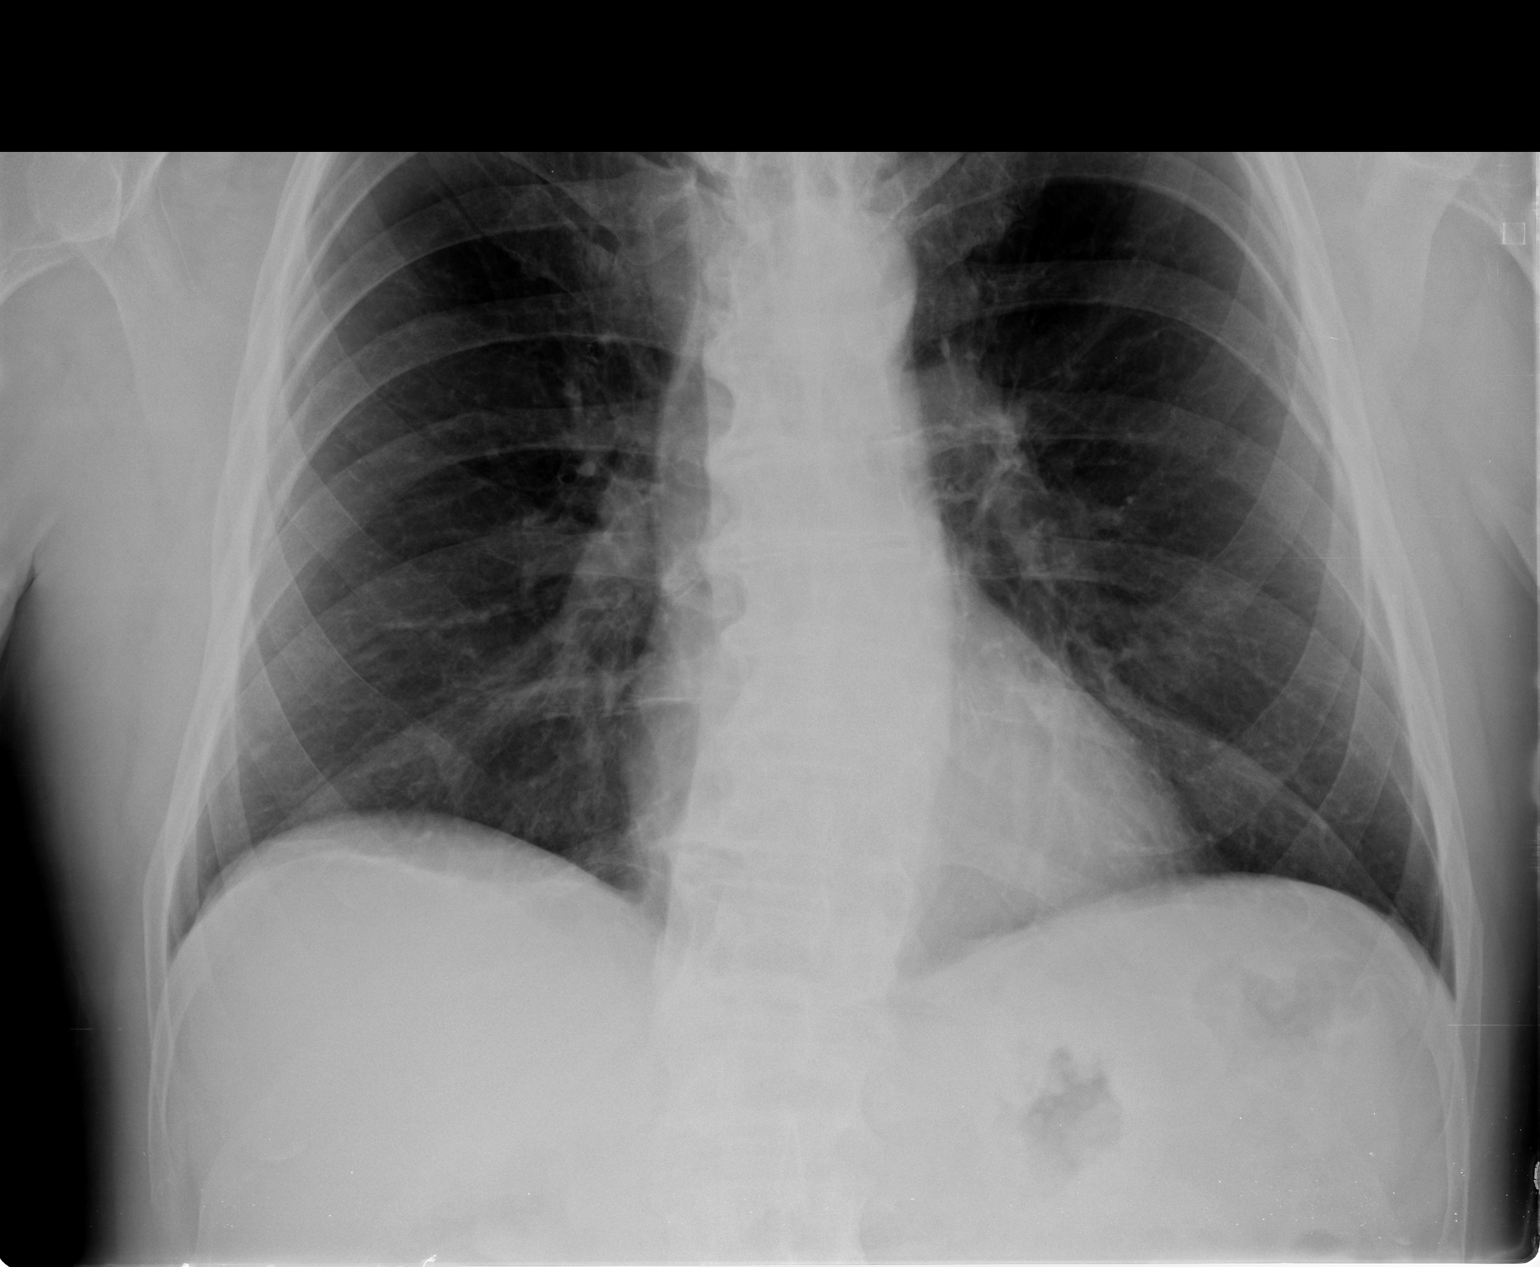

[view not recorded (2 of 3)]
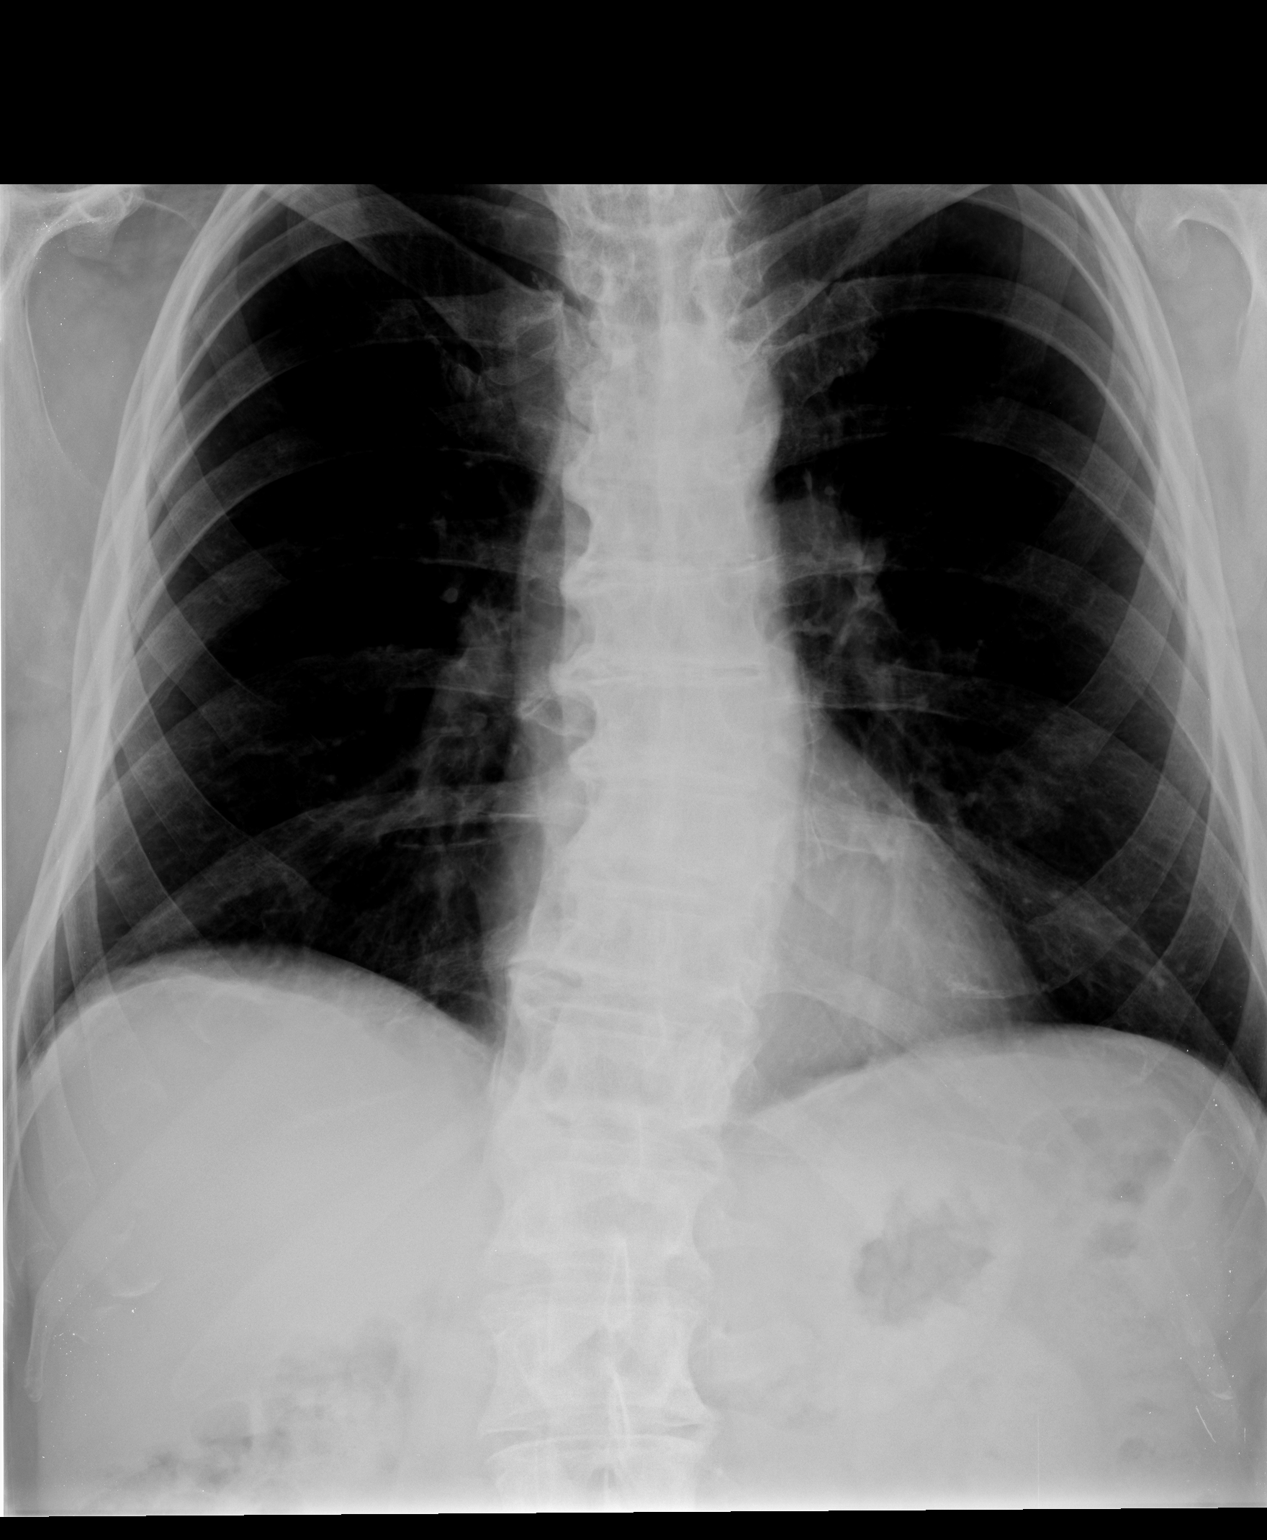

[view not recorded (3 of 3)]
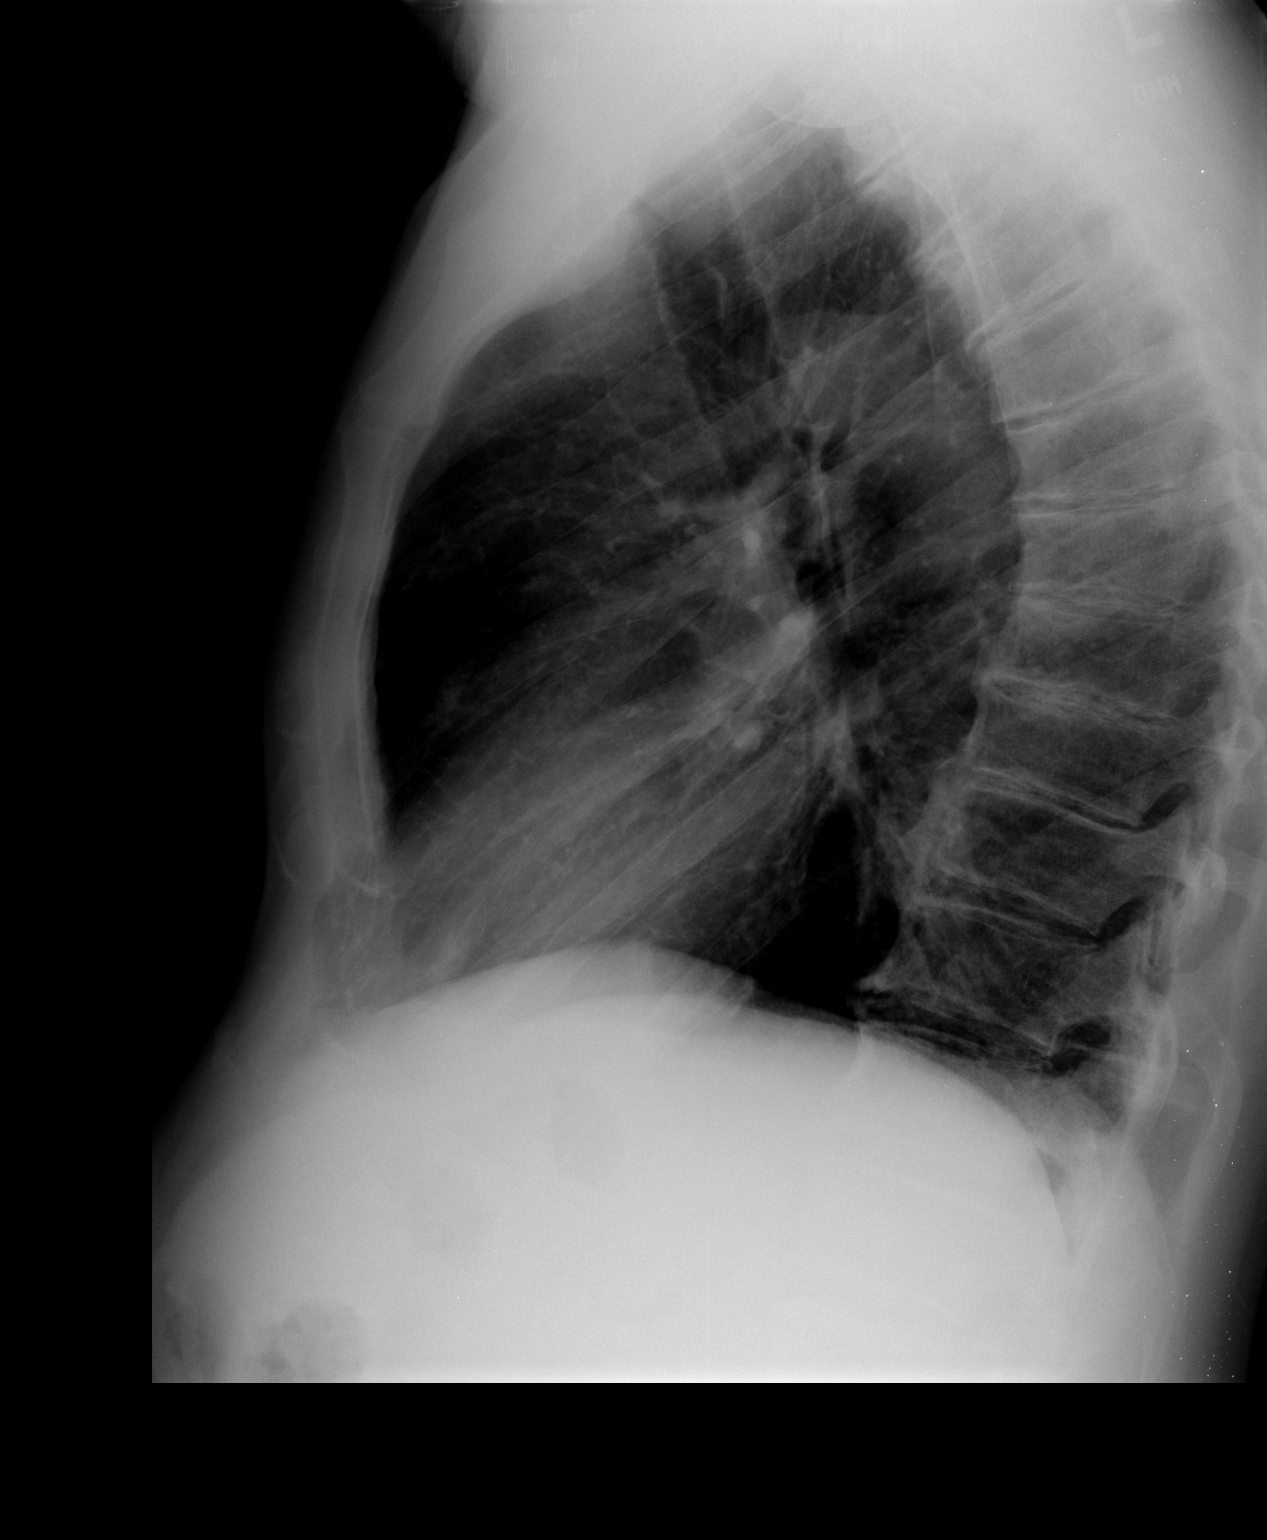

[3 of 3 positions shown; findings below may reference images not displayed]

FINDINGS: Trachea is midline.  Heart size normal.  Biapical pleural
thickening.  High density along the right hemidiaphragm may be due
to a calcified pleural plaque.  Lungs are otherwise clear.  No
pleural fluid.  Degenerative changes are seen in the spine.
IMPRESSION: No acute findings.

## 2014-03-26 DIAGNOSIS — N400 Enlarged prostate without lower urinary tract symptoms: Secondary | ICD-10-CM | POA: Diagnosis not present

## 2014-03-26 DIAGNOSIS — N486 Induration penis plastica: Secondary | ICD-10-CM | POA: Diagnosis not present

## 2014-03-26 DIAGNOSIS — N529 Male erectile dysfunction, unspecified: Secondary | ICD-10-CM | POA: Diagnosis not present

## 2014-03-26 DIAGNOSIS — R312 Other microscopic hematuria: Secondary | ICD-10-CM | POA: Diagnosis not present

## 2014-03-26 DIAGNOSIS — Z87442 Personal history of urinary calculi: Secondary | ICD-10-CM | POA: Diagnosis not present

## 2014-04-07 DIAGNOSIS — D225 Melanocytic nevi of trunk: Secondary | ICD-10-CM | POA: Diagnosis not present

## 2014-04-07 DIAGNOSIS — D485 Neoplasm of uncertain behavior of skin: Secondary | ICD-10-CM | POA: Diagnosis not present

## 2014-04-07 DIAGNOSIS — L820 Inflamed seborrheic keratosis: Secondary | ICD-10-CM | POA: Diagnosis not present

## 2014-04-07 DIAGNOSIS — Z85828 Personal history of other malignant neoplasm of skin: Secondary | ICD-10-CM | POA: Diagnosis not present

## 2014-04-07 DIAGNOSIS — Z08 Encounter for follow-up examination after completed treatment for malignant neoplasm: Secondary | ICD-10-CM | POA: Diagnosis not present

## 2014-04-24 DIAGNOSIS — E119 Type 2 diabetes mellitus without complications: Secondary | ICD-10-CM | POA: Diagnosis not present

## 2014-04-24 DIAGNOSIS — Z79899 Other long term (current) drug therapy: Secondary | ICD-10-CM | POA: Diagnosis not present

## 2014-04-24 DIAGNOSIS — E785 Hyperlipidemia, unspecified: Secondary | ICD-10-CM | POA: Diagnosis not present

## 2014-04-30 DIAGNOSIS — K219 Gastro-esophageal reflux disease without esophagitis: Secondary | ICD-10-CM | POA: Diagnosis not present

## 2014-04-30 DIAGNOSIS — E119 Type 2 diabetes mellitus without complications: Secondary | ICD-10-CM | POA: Diagnosis not present

## 2014-04-30 DIAGNOSIS — E785 Hyperlipidemia, unspecified: Secondary | ICD-10-CM | POA: Diagnosis not present

## 2014-05-08 DIAGNOSIS — J101 Influenza due to other identified influenza virus with other respiratory manifestations: Secondary | ICD-10-CM | POA: Diagnosis not present

## 2014-05-15 DIAGNOSIS — R21 Rash and other nonspecific skin eruption: Secondary | ICD-10-CM | POA: Diagnosis not present

## 2014-07-14 DIAGNOSIS — M25519 Pain in unspecified shoulder: Secondary | ICD-10-CM | POA: Diagnosis not present

## 2014-07-17 DIAGNOSIS — M25512 Pain in left shoulder: Secondary | ICD-10-CM | POA: Diagnosis not present

## 2014-08-07 DIAGNOSIS — M25512 Pain in left shoulder: Secondary | ICD-10-CM | POA: Diagnosis not present

## 2014-08-17 DIAGNOSIS — M25612 Stiffness of left shoulder, not elsewhere classified: Secondary | ICD-10-CM | POA: Diagnosis not present

## 2014-08-17 DIAGNOSIS — M6281 Muscle weakness (generalized): Secondary | ICD-10-CM | POA: Diagnosis not present

## 2014-08-17 DIAGNOSIS — M25512 Pain in left shoulder: Secondary | ICD-10-CM | POA: Diagnosis not present

## 2014-09-02 DIAGNOSIS — R0982 Postnasal drip: Secondary | ICD-10-CM | POA: Diagnosis not present

## 2014-09-02 DIAGNOSIS — J343 Hypertrophy of nasal turbinates: Secondary | ICD-10-CM | POA: Diagnosis not present

## 2014-09-02 DIAGNOSIS — J342 Deviated nasal septum: Secondary | ICD-10-CM | POA: Diagnosis not present

## 2014-09-02 DIAGNOSIS — H903 Sensorineural hearing loss, bilateral: Secondary | ICD-10-CM | POA: Diagnosis not present

## 2014-09-04 DIAGNOSIS — E119 Type 2 diabetes mellitus without complications: Secondary | ICD-10-CM | POA: Diagnosis not present

## 2014-09-04 DIAGNOSIS — E785 Hyperlipidemia, unspecified: Secondary | ICD-10-CM | POA: Diagnosis not present

## 2014-09-04 DIAGNOSIS — Z79899 Other long term (current) drug therapy: Secondary | ICD-10-CM | POA: Diagnosis not present

## 2014-09-05 DIAGNOSIS — M25512 Pain in left shoulder: Secondary | ICD-10-CM | POA: Diagnosis not present

## 2014-09-08 DIAGNOSIS — N529 Male erectile dysfunction, unspecified: Secondary | ICD-10-CM | POA: Diagnosis not present

## 2014-09-08 DIAGNOSIS — K219 Gastro-esophageal reflux disease without esophagitis: Secondary | ICD-10-CM | POA: Diagnosis not present

## 2014-09-08 DIAGNOSIS — E785 Hyperlipidemia, unspecified: Secondary | ICD-10-CM | POA: Diagnosis not present

## 2014-09-08 DIAGNOSIS — E119 Type 2 diabetes mellitus without complications: Secondary | ICD-10-CM | POA: Diagnosis not present

## 2014-09-11 DIAGNOSIS — M25512 Pain in left shoulder: Secondary | ICD-10-CM | POA: Diagnosis not present

## 2014-12-14 DIAGNOSIS — H43813 Vitreous degeneration, bilateral: Secondary | ICD-10-CM | POA: Diagnosis not present

## 2015-01-01 DIAGNOSIS — J343 Hypertrophy of nasal turbinates: Secondary | ICD-10-CM | POA: Diagnosis not present

## 2015-01-01 DIAGNOSIS — R0981 Nasal congestion: Secondary | ICD-10-CM | POA: Diagnosis not present

## 2015-01-01 DIAGNOSIS — Z87891 Personal history of nicotine dependence: Secondary | ICD-10-CM | POA: Diagnosis not present

## 2015-01-01 DIAGNOSIS — H903 Sensorineural hearing loss, bilateral: Secondary | ICD-10-CM | POA: Diagnosis not present

## 2015-01-20 DIAGNOSIS — E785 Hyperlipidemia, unspecified: Secondary | ICD-10-CM | POA: Diagnosis not present

## 2015-01-20 DIAGNOSIS — E119 Type 2 diabetes mellitus without complications: Secondary | ICD-10-CM | POA: Diagnosis not present

## 2015-01-27 DIAGNOSIS — Z23 Encounter for immunization: Secondary | ICD-10-CM | POA: Diagnosis not present

## 2015-03-30 DIAGNOSIS — M25512 Pain in left shoulder: Secondary | ICD-10-CM | POA: Diagnosis not present

## 2015-04-01 DIAGNOSIS — D485 Neoplasm of uncertain behavior of skin: Secondary | ICD-10-CM | POA: Diagnosis not present

## 2015-04-01 DIAGNOSIS — B078 Other viral warts: Secondary | ICD-10-CM | POA: Diagnosis not present

## 2015-04-01 DIAGNOSIS — L570 Actinic keratosis: Secondary | ICD-10-CM | POA: Diagnosis not present

## 2015-04-01 DIAGNOSIS — L812 Freckles: Secondary | ICD-10-CM | POA: Diagnosis not present

## 2015-04-02 DIAGNOSIS — M25512 Pain in left shoulder: Secondary | ICD-10-CM | POA: Diagnosis not present

## 2015-04-12 DIAGNOSIS — M7502 Adhesive capsulitis of left shoulder: Secondary | ICD-10-CM | POA: Diagnosis not present

## 2015-04-12 DIAGNOSIS — M19012 Primary osteoarthritis, left shoulder: Secondary | ICD-10-CM | POA: Diagnosis not present

## 2015-04-12 DIAGNOSIS — M66812 Spontaneous rupture of other tendons, left shoulder: Secondary | ICD-10-CM | POA: Diagnosis not present

## 2015-04-12 DIAGNOSIS — M7542 Impingement syndrome of left shoulder: Secondary | ICD-10-CM | POA: Diagnosis not present

## 2015-04-12 DIAGNOSIS — M94212 Chondromalacia, left shoulder: Secondary | ICD-10-CM | POA: Diagnosis not present

## 2015-04-12 DIAGNOSIS — M7552 Bursitis of left shoulder: Secondary | ICD-10-CM | POA: Diagnosis not present

## 2015-04-12 DIAGNOSIS — M7522 Bicipital tendinitis, left shoulder: Secondary | ICD-10-CM | POA: Diagnosis not present

## 2015-04-12 DIAGNOSIS — M75112 Incomplete rotator cuff tear or rupture of left shoulder, not specified as traumatic: Secondary | ICD-10-CM | POA: Diagnosis not present

## 2015-04-12 DIAGNOSIS — G8918 Other acute postprocedural pain: Secondary | ICD-10-CM | POA: Diagnosis not present

## 2015-04-12 DIAGNOSIS — M24112 Other articular cartilage disorders, left shoulder: Secondary | ICD-10-CM | POA: Diagnosis not present

## 2015-04-12 HISTORY — PX: SHOULDER SURGERY: SHX246

## 2015-04-20 DIAGNOSIS — M7582 Other shoulder lesions, left shoulder: Secondary | ICD-10-CM | POA: Diagnosis not present

## 2015-04-21 DIAGNOSIS — M7522 Bicipital tendinitis, left shoulder: Secondary | ICD-10-CM | POA: Diagnosis not present

## 2015-04-21 DIAGNOSIS — M7502 Adhesive capsulitis of left shoulder: Secondary | ICD-10-CM | POA: Diagnosis not present

## 2015-04-21 DIAGNOSIS — M25512 Pain in left shoulder: Secondary | ICD-10-CM | POA: Diagnosis not present

## 2015-04-21 DIAGNOSIS — M7542 Impingement syndrome of left shoulder: Secondary | ICD-10-CM | POA: Diagnosis not present

## 2015-04-22 ENCOUNTER — Ambulatory Visit (HOSPITAL_BASED_OUTPATIENT_CLINIC_OR_DEPARTMENT_OTHER): Admission: RE | Admit: 2015-04-22 | Payer: Medicare Other | Source: Ambulatory Visit | Admitting: Orthopedic Surgery

## 2015-04-22 ENCOUNTER — Encounter (HOSPITAL_BASED_OUTPATIENT_CLINIC_OR_DEPARTMENT_OTHER): Admission: RE | Payer: Self-pay | Source: Ambulatory Visit

## 2015-04-22 SURGERY — SHOULDER ARTHROSCOPY WITH SUBACROMIAL DECOMPRESSION, ROTATOR CUFF REPAIR AND BICEP TENDON REPAIR
Anesthesia: General | Laterality: Left

## 2015-04-29 DIAGNOSIS — M7542 Impingement syndrome of left shoulder: Secondary | ICD-10-CM | POA: Diagnosis not present

## 2015-04-29 DIAGNOSIS — M7522 Bicipital tendinitis, left shoulder: Secondary | ICD-10-CM | POA: Diagnosis not present

## 2015-04-29 DIAGNOSIS — M7502 Adhesive capsulitis of left shoulder: Secondary | ICD-10-CM | POA: Diagnosis not present

## 2015-04-29 DIAGNOSIS — M25512 Pain in left shoulder: Secondary | ICD-10-CM | POA: Diagnosis not present

## 2015-04-30 DIAGNOSIS — M7502 Adhesive capsulitis of left shoulder: Secondary | ICD-10-CM | POA: Diagnosis not present

## 2015-04-30 DIAGNOSIS — M7542 Impingement syndrome of left shoulder: Secondary | ICD-10-CM | POA: Diagnosis not present

## 2015-04-30 DIAGNOSIS — M7522 Bicipital tendinitis, left shoulder: Secondary | ICD-10-CM | POA: Diagnosis not present

## 2015-04-30 DIAGNOSIS — M25512 Pain in left shoulder: Secondary | ICD-10-CM | POA: Diagnosis not present

## 2015-05-03 DIAGNOSIS — M7542 Impingement syndrome of left shoulder: Secondary | ICD-10-CM | POA: Diagnosis not present

## 2015-05-03 DIAGNOSIS — M25512 Pain in left shoulder: Secondary | ICD-10-CM | POA: Diagnosis not present

## 2015-05-03 DIAGNOSIS — M7522 Bicipital tendinitis, left shoulder: Secondary | ICD-10-CM | POA: Diagnosis not present

## 2015-05-03 DIAGNOSIS — M7502 Adhesive capsulitis of left shoulder: Secondary | ICD-10-CM | POA: Diagnosis not present

## 2015-05-05 DIAGNOSIS — M25512 Pain in left shoulder: Secondary | ICD-10-CM | POA: Diagnosis not present

## 2015-05-05 DIAGNOSIS — M7522 Bicipital tendinitis, left shoulder: Secondary | ICD-10-CM | POA: Diagnosis not present

## 2015-05-05 DIAGNOSIS — M7502 Adhesive capsulitis of left shoulder: Secondary | ICD-10-CM | POA: Diagnosis not present

## 2015-05-05 DIAGNOSIS — M7542 Impingement syndrome of left shoulder: Secondary | ICD-10-CM | POA: Diagnosis not present

## 2015-05-07 ENCOUNTER — Other Ambulatory Visit (HOSPITAL_COMMUNITY): Payer: Self-pay | Admitting: Orthopedic Surgery

## 2015-05-07 ENCOUNTER — Inpatient Hospital Stay (HOSPITAL_COMMUNITY): Admission: RE | Admit: 2015-05-07 | Payer: Medicare Other | Source: Ambulatory Visit

## 2015-05-07 DIAGNOSIS — M7502 Adhesive capsulitis of left shoulder: Secondary | ICD-10-CM | POA: Diagnosis not present

## 2015-05-07 DIAGNOSIS — M25512 Pain in left shoulder: Secondary | ICD-10-CM | POA: Diagnosis not present

## 2015-05-07 DIAGNOSIS — M25562 Pain in left knee: Secondary | ICD-10-CM | POA: Diagnosis not present

## 2015-05-07 DIAGNOSIS — I824Z2 Acute embolism and thrombosis of unspecified deep veins of left distal lower extremity: Secondary | ICD-10-CM | POA: Diagnosis not present

## 2015-05-07 DIAGNOSIS — I82492 Acute embolism and thrombosis of other specified deep vein of left lower extremity: Secondary | ICD-10-CM | POA: Diagnosis not present

## 2015-05-07 DIAGNOSIS — M7542 Impingement syndrome of left shoulder: Secondary | ICD-10-CM | POA: Diagnosis not present

## 2015-05-07 DIAGNOSIS — M7522 Bicipital tendinitis, left shoulder: Secondary | ICD-10-CM | POA: Diagnosis not present

## 2015-05-07 DIAGNOSIS — M79605 Pain in left leg: Secondary | ICD-10-CM

## 2015-05-10 DIAGNOSIS — M7522 Bicipital tendinitis, left shoulder: Secondary | ICD-10-CM | POA: Diagnosis not present

## 2015-05-10 DIAGNOSIS — M7542 Impingement syndrome of left shoulder: Secondary | ICD-10-CM | POA: Diagnosis not present

## 2015-05-10 DIAGNOSIS — M7502 Adhesive capsulitis of left shoulder: Secondary | ICD-10-CM | POA: Diagnosis not present

## 2015-05-10 DIAGNOSIS — M25512 Pain in left shoulder: Secondary | ICD-10-CM | POA: Diagnosis not present

## 2015-05-11 ENCOUNTER — Encounter: Payer: Self-pay | Admitting: Cardiovascular Disease

## 2015-05-11 ENCOUNTER — Ambulatory Visit (INDEPENDENT_AMBULATORY_CARE_PROVIDER_SITE_OTHER): Payer: Medicare Other | Admitting: Cardiovascular Disease

## 2015-05-11 VITALS — BP 138/84 | HR 72 | Ht 73.0 in | Wt 210.1 lb

## 2015-05-11 DIAGNOSIS — I82442 Acute embolism and thrombosis of left tibial vein: Secondary | ICD-10-CM

## 2015-05-11 LAB — CBC
HCT: 46.4 % (ref 39.0–52.0)
Hemoglobin: 16.3 g/dL (ref 13.0–17.0)
MCH: 32 pg (ref 26.0–34.0)
MCHC: 35.1 g/dL (ref 30.0–36.0)
MCV: 91.2 fL (ref 78.0–100.0)
MPV: 9 fL (ref 8.6–12.4)
Platelets: 213 10*3/uL (ref 150–400)
RBC: 5.09 MIL/uL (ref 4.22–5.81)
RDW: 13.2 % (ref 11.5–15.5)
WBC: 7 10*3/uL (ref 4.0–10.5)

## 2015-05-11 LAB — BASIC METABOLIC PANEL
BUN: 15 mg/dL (ref 7–25)
CO2: 24 mmol/L (ref 20–31)
Calcium: 9.1 mg/dL (ref 8.6–10.3)
Chloride: 104 mmol/L (ref 98–110)
Creat: 0.97 mg/dL (ref 0.70–1.18)
Glucose, Bld: 122 mg/dL — ABNORMAL HIGH (ref 65–99)
Potassium: 4.2 mmol/L (ref 3.5–5.3)
Sodium: 139 mmol/L (ref 135–146)

## 2015-05-11 LAB — D-DIMER, QUANTITATIVE: D-Dimer, Quant: 0.65 ug/mL-FEU — ABNORMAL HIGH (ref 0.00–0.48)

## 2015-05-11 MED ORDER — RIVAROXABAN 20 MG PO TABS: 20.0000 mg | ORAL_TABLET | Freq: Every day | ORAL | Status: DC

## 2015-05-11 NOTE — Progress Notes (Signed)
Cardiology Office Note   Date:  05/11/2015   ID:  Randy Jensen, Randy Jensen 01-02-1946, MRN [id number]  PCP:  Dr. Julie Martinique Cardiologist:   Kathlyn Sacramento, MD   No chief complaint on file.     History of Present Illness: Randy Jensen is a 70 y.o. male who was referred by Dr. Percell Miller for evaluation of left leg DVT. The patient has no previous cardiac history. He has known history of diet-controlled diabetes, arthritis and erectile dysfunction. He is not a smoker and does not drink alcohol. He underwent left shoulder surgery in February 13 without complications. After that, he started sleeping in the recliner and did not do much of physical activities. He noticed left calf pain about 2 weeks ago and thus he was seen in clinic and referred for venous duplex which showed evidence of a thrombus in the left peroneal artery. The patient did not notice much swelling. He has been started on Xarelto since Friday and he has been taking it without side effects. He reports that the left calf pain has subsided. He has no claudication. He denies any chest pain or shortness of breath. He has no family history of PE or DVT. He does have family history of coronary artery disease which affected both his father and brother. The patient works as a Teacher, adult education.    Past Medical History  Diagnosis Date  . Arthritis   . Cervical spinal stenosis   . Hiatal hernia     pt states that he takes Pantoprazole daily for hiatal hernia but denies GERD  . Hx of colonic polyps   . Blood in urine     occ trace per pt   . Diabetes mellitus     "borderline" doesn't require meds;diet and exercise;last  A1 C 6.2 a month ago  . Cataracts, bilateral     Past Surgical History  Procedure Laterality Date  . Back surgery  1982  . Finger surgery  2012    right thumb   . Arm surgery      at age 36;broke arm and had to go to the OR  . Colonoscopy    . Anterior cervical decomp/discectomy fusion  02/16/2011   Procedure: ANTERIOR CERVICAL DECOMPRESSION/DISCECTOMY FUSION 3 LEVELS;  Surgeon: Eustace Moore;  Location: [facility];  Service: Neurosurgery;  Laterality: N/A;  Cervical Three-four,Cervical Four-Five,Cervical Five-Six,Anterior Cervical Decompression and Fusion with Nuvasive Plating  . Shoulder surgery  04/12/2015     Current Outpatient Prescriptions  Medication Sig Dispense Refill  . aspirin EC 81 MG tablet Take 81 mg by mouth. Sometimes    . ibuprofen (ADVIL,MOTRIN) 800 MG tablet Take 800 mg by mouth as needed.    . loratadine (CLARITIN) 10 MG tablet Take 10 mg by mouth as needed.    . pantoprazole (PROTONIX) 40 MG tablet Take 40 mg by mouth daily.      . sildenafil (REVATIO) 20 MG tablet Take 5 tablets by mouth as needed.    Alveda Reasons STARTER PACK 15 & 20 MG TBPK Take 1 tablet by mouth 2 (two) times daily.  0  . rivaroxaban (XARELTO) 20 MG TABS tablet Take 1 tablet (20 mg total) by mouth daily with supper. 30 tablet 2   No current facility-administered medications for this visit.    Allergies:   Niacin and related; Penicillins; and Sulfa antibiotics    Social History:  The patient  reports that he has quit smoking. He does not have  any smokeless tobacco history on file. He reports that he does not drink alcohol or use illicit drugs.   Family History:  The patient's family history is negative for Anesthesia problems, Hypotension, Malignant hyperthermia, and Pseudochol deficiency.    ROS:  Please see the history of present illness.   Otherwise, review of systems are positive for none.   All other systems are reviewed and negative.    PHYSICAL EXAM: VS:  BP 138/84 mmHg  Pulse 72  Ht 6\' 1"  (1.854 m)  Wt 210 lb 1.6 oz (95.301 kg)  BMI 27.73 kg/m2 , BMI Body mass index is 27.73 kg/(m^2). GEN: Well nourished, well developed, in no acute distress HEENT: normal Neck: no JVD, carotid bruits, or masses Cardiac: RRR; no murmurs, rubs, or gallops. Respiratory:  clear to auscultation  bilaterally, normal work of breathing GI: soft, nontender, nondistended, + BS MS: no deformity or atrophy Skin: warm and dry, no rash Neuro:  Strength and sensation are intact Psych: euthymic mood, full affect Vascular: Femoral pulses are normal. Distal pulses are palpable. There is mild left leg swelling with mild tenderness.  EKG:  EKG is ordered today. The ekg ordered today demonstrates normal sinus rhythm with no significant ST or T wave changes.   Recent Labs: No results found for requested labs within last 365 days.    Lipid Panel No results found for: CHOL, TRIG, HDL, CHOLHDL, VLDL, LDLCALC, LDLDIRECT    Wt Readings from Last 3 Encounters:  05/11/15 210 lb 1.6 oz (95.301 kg)  02/10/11 206 lb 5.6 oz (93.6 kg)      Other studies Reviewed: Additional studies/ records that were reviewed today include: Left lower extremity venous duplex. Review of the above records demonstrates: Thrombus in the left peroneal vein at the level of the mid calf   ASSESSMENT AND PLAN:  1.  Left leg DVT involving the left peroneal artery: This is his first documented DVT and likely was triggered by decreased physical activities after his left shoulder surgery. I agree with anticoagulation. I'm going to check routine labs today given that he was started on anticoagulation. He does work as a Geophysicist/field seismologist and I gave him instructions to ambulate at least every 2 hours. I'm going to recheck his lower extremity venous duplex in 2 months. He will likely require 3 months of anticoagulation.  The patient otherwise has no symptoms suggestive of pulmonary embolism. He has no cardiac symptoms.     Disposition:   FU with me in 2 months  Signed,  Kathlyn Sacramento, MD  05/11/2015 1:15 PM    [facility]

## 2015-05-11 NOTE — Patient Instructions (Signed)
Medication Instructions:  Your physician has recommended you make the following change in your medication:  1. Please START Xarelto 20mg  one tablet by mouth daily with your evening meal when you finish your current Xarelto supply  Labwork: Your physician recommends that you have lab work today: CBC, BMP and D-dimer  Testing/Procedures: Your physician has requested that you have a LEFT lower extremity venous duplex in 2 MONTHS (same day as MD appointment). This test is an ultrasound of the veins in the legs. It looks at venous blood flow that carries blood from the heart to the legs. Allow one hour for a Lower Venous exam. There are no restrictions or special instructions.  Follow-Up: Your physician recommends that you schedule a follow-up appointment in: 2 MONTHS with Dr Fletcher Anon   Any Other Special Instructions Will Be Listed Below (If Applicable).     If you need a refill on your cardiac medications before your next appointment, please call your pharmacy.

## 2015-05-14 DIAGNOSIS — M7522 Bicipital tendinitis, left shoulder: Secondary | ICD-10-CM | POA: Diagnosis not present

## 2015-05-14 DIAGNOSIS — M25512 Pain in left shoulder: Secondary | ICD-10-CM | POA: Diagnosis not present

## 2015-05-14 DIAGNOSIS — M7542 Impingement syndrome of left shoulder: Secondary | ICD-10-CM | POA: Diagnosis not present

## 2015-05-14 DIAGNOSIS — M7502 Adhesive capsulitis of left shoulder: Secondary | ICD-10-CM | POA: Diagnosis not present

## 2015-05-17 ENCOUNTER — Telehealth: Payer: Self-pay

## 2015-05-17 DIAGNOSIS — M7542 Impingement syndrome of left shoulder: Secondary | ICD-10-CM | POA: Diagnosis not present

## 2015-05-17 DIAGNOSIS — M7522 Bicipital tendinitis, left shoulder: Secondary | ICD-10-CM | POA: Diagnosis not present

## 2015-05-17 DIAGNOSIS — M25512 Pain in left shoulder: Secondary | ICD-10-CM | POA: Diagnosis not present

## 2015-05-17 DIAGNOSIS — M7502 Adhesive capsulitis of left shoulder: Secondary | ICD-10-CM | POA: Diagnosis not present

## 2015-05-17 NOTE — Telephone Encounter (Signed)
Prior auth for Xarelto 20mg sent to [facility]. 

## 2015-05-18 ENCOUNTER — Telehealth: Payer: Self-pay

## 2015-05-18 NOTE — Telephone Encounter (Signed)
Xarelto approved through 02/27/2016. PA- [id number].

## 2015-05-19 DIAGNOSIS — I82442 Acute embolism and thrombosis of left tibial vein: Secondary | ICD-10-CM | POA: Diagnosis not present

## 2015-05-19 DIAGNOSIS — M25512 Pain in left shoulder: Secondary | ICD-10-CM | POA: Diagnosis not present

## 2015-05-19 DIAGNOSIS — J301 Allergic rhinitis due to pollen: Secondary | ICD-10-CM | POA: Diagnosis not present

## 2015-05-19 DIAGNOSIS — M7502 Adhesive capsulitis of left shoulder: Secondary | ICD-10-CM | POA: Diagnosis not present

## 2015-05-19 DIAGNOSIS — M7522 Bicipital tendinitis, left shoulder: Secondary | ICD-10-CM | POA: Diagnosis not present

## 2015-05-19 DIAGNOSIS — M7542 Impingement syndrome of left shoulder: Secondary | ICD-10-CM | POA: Diagnosis not present

## 2015-05-21 DIAGNOSIS — M7542 Impingement syndrome of left shoulder: Secondary | ICD-10-CM | POA: Diagnosis not present

## 2015-05-21 DIAGNOSIS — M25512 Pain in left shoulder: Secondary | ICD-10-CM | POA: Diagnosis not present

## 2015-05-21 DIAGNOSIS — M7502 Adhesive capsulitis of left shoulder: Secondary | ICD-10-CM | POA: Diagnosis not present

## 2015-05-21 DIAGNOSIS — M7522 Bicipital tendinitis, left shoulder: Secondary | ICD-10-CM | POA: Diagnosis not present

## 2015-05-24 DIAGNOSIS — M7542 Impingement syndrome of left shoulder: Secondary | ICD-10-CM | POA: Diagnosis not present

## 2015-05-24 DIAGNOSIS — M7502 Adhesive capsulitis of left shoulder: Secondary | ICD-10-CM | POA: Diagnosis not present

## 2015-05-24 DIAGNOSIS — M7522 Bicipital tendinitis, left shoulder: Secondary | ICD-10-CM | POA: Diagnosis not present

## 2015-05-24 DIAGNOSIS — M25512 Pain in left shoulder: Secondary | ICD-10-CM | POA: Diagnosis not present

## 2015-05-25 DIAGNOSIS — M25512 Pain in left shoulder: Secondary | ICD-10-CM | POA: Diagnosis not present

## 2015-05-25 DIAGNOSIS — L810 Postinflammatory hyperpigmentation: Secondary | ICD-10-CM | POA: Diagnosis not present

## 2015-05-28 DIAGNOSIS — M25512 Pain in left shoulder: Secondary | ICD-10-CM | POA: Diagnosis not present

## 2015-05-28 DIAGNOSIS — M7542 Impingement syndrome of left shoulder: Secondary | ICD-10-CM | POA: Diagnosis not present

## 2015-05-28 DIAGNOSIS — M7522 Bicipital tendinitis, left shoulder: Secondary | ICD-10-CM | POA: Diagnosis not present

## 2015-05-28 DIAGNOSIS — M7502 Adhesive capsulitis of left shoulder: Secondary | ICD-10-CM | POA: Diagnosis not present

## 2015-05-29 LAB — PSA: PSA: NORMAL

## 2015-05-31 DIAGNOSIS — M25512 Pain in left shoulder: Secondary | ICD-10-CM | POA: Diagnosis not present

## 2015-05-31 DIAGNOSIS — M7542 Impingement syndrome of left shoulder: Secondary | ICD-10-CM | POA: Diagnosis not present

## 2015-05-31 DIAGNOSIS — M7522 Bicipital tendinitis, left shoulder: Secondary | ICD-10-CM | POA: Diagnosis not present

## 2015-05-31 DIAGNOSIS — M7502 Adhesive capsulitis of left shoulder: Secondary | ICD-10-CM | POA: Diagnosis not present

## 2015-06-02 DIAGNOSIS — M7502 Adhesive capsulitis of left shoulder: Secondary | ICD-10-CM | POA: Diagnosis not present

## 2015-06-02 DIAGNOSIS — M25512 Pain in left shoulder: Secondary | ICD-10-CM | POA: Diagnosis not present

## 2015-06-02 DIAGNOSIS — M7522 Bicipital tendinitis, left shoulder: Secondary | ICD-10-CM | POA: Diagnosis not present

## 2015-06-02 DIAGNOSIS — M7542 Impingement syndrome of left shoulder: Secondary | ICD-10-CM | POA: Diagnosis not present

## 2015-06-04 DIAGNOSIS — M7522 Bicipital tendinitis, left shoulder: Secondary | ICD-10-CM | POA: Diagnosis not present

## 2015-06-04 DIAGNOSIS — M7502 Adhesive capsulitis of left shoulder: Secondary | ICD-10-CM | POA: Diagnosis not present

## 2015-06-04 DIAGNOSIS — M25512 Pain in left shoulder: Secondary | ICD-10-CM | POA: Diagnosis not present

## 2015-06-04 DIAGNOSIS — M7542 Impingement syndrome of left shoulder: Secondary | ICD-10-CM | POA: Diagnosis not present

## 2015-06-07 DIAGNOSIS — M7522 Bicipital tendinitis, left shoulder: Secondary | ICD-10-CM | POA: Diagnosis not present

## 2015-06-07 DIAGNOSIS — M25512 Pain in left shoulder: Secondary | ICD-10-CM | POA: Diagnosis not present

## 2015-06-07 DIAGNOSIS — M7542 Impingement syndrome of left shoulder: Secondary | ICD-10-CM | POA: Diagnosis not present

## 2015-06-07 DIAGNOSIS — M7502 Adhesive capsulitis of left shoulder: Secondary | ICD-10-CM | POA: Diagnosis not present

## 2015-06-16 DIAGNOSIS — M7502 Adhesive capsulitis of left shoulder: Secondary | ICD-10-CM | POA: Diagnosis not present

## 2015-06-16 DIAGNOSIS — M25512 Pain in left shoulder: Secondary | ICD-10-CM | POA: Diagnosis not present

## 2015-06-16 DIAGNOSIS — M7522 Bicipital tendinitis, left shoulder: Secondary | ICD-10-CM | POA: Diagnosis not present

## 2015-06-16 DIAGNOSIS — M7542 Impingement syndrome of left shoulder: Secondary | ICD-10-CM | POA: Diagnosis not present

## 2015-06-18 DIAGNOSIS — M7542 Impingement syndrome of left shoulder: Secondary | ICD-10-CM | POA: Diagnosis not present

## 2015-06-18 DIAGNOSIS — M7522 Bicipital tendinitis, left shoulder: Secondary | ICD-10-CM | POA: Diagnosis not present

## 2015-06-18 DIAGNOSIS — M25512 Pain in left shoulder: Secondary | ICD-10-CM | POA: Diagnosis not present

## 2015-06-18 DIAGNOSIS — M7502 Adhesive capsulitis of left shoulder: Secondary | ICD-10-CM | POA: Diagnosis not present

## 2015-06-22 DIAGNOSIS — M7502 Adhesive capsulitis of left shoulder: Secondary | ICD-10-CM | POA: Diagnosis not present

## 2015-06-22 DIAGNOSIS — M25512 Pain in left shoulder: Secondary | ICD-10-CM | POA: Diagnosis not present

## 2015-06-22 DIAGNOSIS — M7542 Impingement syndrome of left shoulder: Secondary | ICD-10-CM | POA: Diagnosis not present

## 2015-06-22 DIAGNOSIS — M7522 Bicipital tendinitis, left shoulder: Secondary | ICD-10-CM | POA: Diagnosis not present

## 2015-07-06 DIAGNOSIS — M25512 Pain in left shoulder: Secondary | ICD-10-CM | POA: Diagnosis not present

## 2015-07-16 ENCOUNTER — Ambulatory Visit (HOSPITAL_COMMUNITY): Payer: Medicare Other

## 2015-07-20 ENCOUNTER — Ambulatory Visit: Payer: Medicare Other | Admitting: Cardiovascular Disease

## 2015-07-20 ENCOUNTER — Inpatient Hospital Stay (HOSPITAL_COMMUNITY): Admission: RE | Admit: 2015-07-20 | Payer: Medicare Other | Source: Ambulatory Visit

## 2015-07-22 ENCOUNTER — Ambulatory Visit (HOSPITAL_COMMUNITY)
Admission: RE | Admit: 2015-07-22 | Discharge: 2015-07-22 | Disposition: A | Discharge status: Home / Self Care | Payer: Medicare Other | Source: Ambulatory Visit | Attending: Cardiovascular Disease | Admitting: Cardiovascular Disease

## 2015-07-22 DIAGNOSIS — I82442 Acute embolism and thrombosis of left tibial vein: Secondary | ICD-10-CM | POA: Diagnosis not present

## 2015-07-22 DIAGNOSIS — E119 Type 2 diabetes mellitus without complications: Secondary | ICD-10-CM | POA: Diagnosis not present

## 2015-07-23 DIAGNOSIS — K449 Diaphragmatic hernia without obstruction or gangrene: Secondary | ICD-10-CM | POA: Diagnosis not present

## 2015-07-23 DIAGNOSIS — Z86010 Personal history of colonic polyps: Secondary | ICD-10-CM | POA: Diagnosis not present

## 2015-07-23 DIAGNOSIS — R319 Hematuria, unspecified: Secondary | ICD-10-CM | POA: Diagnosis not present

## 2015-07-23 DIAGNOSIS — Z86718 Personal history of other venous thrombosis and embolism: Secondary | ICD-10-CM | POA: Diagnosis not present

## 2015-07-23 DIAGNOSIS — E119 Type 2 diabetes mellitus without complications: Secondary | ICD-10-CM | POA: Diagnosis not present

## 2015-07-23 DIAGNOSIS — Z0000 Encounter for general adult medical examination without abnormal findings: Secondary | ICD-10-CM | POA: Diagnosis not present

## 2015-07-28 ENCOUNTER — Telehealth: Payer: Self-pay | Admitting: Cardiovascular Disease

## 2015-07-28 NOTE — Telephone Encounter (Signed)
NEW MESSAGE   PT WANTS RN TO CALL FOR RESULTS OF VAS

## 2015-07-28 NOTE — Telephone Encounter (Signed)
28 day supply of Xarelto placed at the front desk at [facility] location.  Pt aware and he will pick up tomorrow. The pt agreed to follow up as needed with our office.

## 2015-07-28 NOTE — Telephone Encounter (Signed)
I spoke with the pt and made him aware of LE venous duplex results. The pt stopped taking Xarelto about a week ago.  The pt said the medication cost him $600.00 for a one month supply and if he does not have to take additional doses then he does not want to pay for the medicine.  The pt also is hesitant about making a follow-up appointment unless it is truly needed since his venous duplex showed no DVT.  I made the pt aware that I will forward this information to Dr Fletcher Anon to make him aware and give further recommendations.

## 2015-07-28 NOTE — Telephone Encounter (Signed)
I recommend anticoagulation for 3 months. I think he only took it for 2 months. Can we give him a 1 month supply then stop? Follow up as needed.

## 2015-08-11 DIAGNOSIS — R7309 Other abnormal glucose: Secondary | ICD-10-CM | POA: Diagnosis not present

## 2015-08-13 DIAGNOSIS — R109 Unspecified abdominal pain: Secondary | ICD-10-CM | POA: Diagnosis not present

## 2015-08-13 DIAGNOSIS — R1030 Lower abdominal pain, unspecified: Secondary | ICD-10-CM | POA: Diagnosis not present

## 2015-08-13 DIAGNOSIS — N400 Enlarged prostate without lower urinary tract symptoms: Secondary | ICD-10-CM | POA: Diagnosis not present

## 2015-09-13 ENCOUNTER — Encounter (HOSPITAL_COMMUNITY): Payer: Self-pay | Admitting: Internal Medicine

## 2015-09-16 ENCOUNTER — Telehealth: Payer: Self-pay | Admitting: Cardiovascular Disease

## 2015-09-16 NOTE — Telephone Encounter (Signed)
New message    The pt is calling when up doing anything the pt is feeling tied the pt seems to think he might a EKG or appointment, the pt wants to speak with a nurse if possible. The pt does not know what to do.

## 2015-09-16 NOTE — Telephone Encounter (Addendum)
Returned call to patient.He stated he has been having pain in his throat when he walks.Stated he has noticed he tires easy for the past couple of months.Stated his brother had same symptoms before he had heart surgery.No throat pain at present.Appointment scheduled 09/22/15 at 10:30 am with Bonney Leitz PA at [facility] office.Advised to go to ER if needed.

## 2015-09-19 NOTE — Progress Notes (Signed)
Cardiology Office Note    Date:  09/22/2015   ID:  PLUMMER LAPRADE, DOB [birth year], MRN [id number]  PCP:  Stacie Glaze, DO  Cardiologist:  Dr. Fletcher Anon  CC:  Throat pain and fatigue (similar sx in brother prior to CABG)  History of Present Illness:  Randy Jensen is a 70 y.o. male with a history of fam hx but no personal hx of CAD, DVT, diet controlled DM, arthritis and ED who presents to clinic for evaluation of throat pain and fatigue.  He was referred to Dr. Fletcher Anon in 04/2015 for evaluation of left leg DVT. He underwent left shoulder surgery on [id number] without complications. After that, he started sleeping in the recliner and did not do much of physical activity. He noticed left calf pain underwent venous duplex which showed evidence of a thrombus in the left peroneal artery. The patient did not notice much swelling. He was started on Xarelto since 05/10/15. Leg pain resolved. He continued this for 3 months. Follow up duplex of leg was negative for DVT in 06/2015 and patient eager to stop Xarelto due to cost ($600 for month supply). 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   Today patient presents to clinic for evaluation of throat pain and fatigue which was his brother's presentation prior to his CABG. He does have family history of coronary artery disease which affected both his father and brother. He is not a smoker and does not drink alcohol. The patient works as a Teacher, adult education.   He has noticed over the past month or so that he gets a funny "hurt" in the front of his throat with walking. It doesn't happen every time he walks but it has happened about 2-3 times so far. For the past 6 months- 1 year, he has been noticing that he feels more fatigued. Even putting out fertilizer the other day just wiped him out and pretty SOB. 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  The only time he feels pretty good is on the gold course (he does not walk and uses a cart). No LE edema, orthopnea or PND. No dizziness or syncope. No palpitations.    Past Medical History:  Diagnosis Date  . Arthritis   . Blood in urine    occ trace per pt   . Cataracts, bilateral   . Cervical spinal stenosis   . Diabetes mellitus    "borderline" doesn't require meds;diet and exercise;last  A1 C 6.2 a month ago  . Hiatal hernia    pt states that he takes Pantoprazole daily for hiatal hernia but denies GERD  . Hx of colonic polyps     Past Surgical History:  Procedure Laterality Date  . ANTERIOR CERVICAL DECOMP/DISCECTOMY FUSION  02/16/2011   Procedure: ANTERIOR CERVICAL DECOMPRESSION/DISCECTOMY FUSION 3 LEVELS;  Surgeon: Eustace Moore;  Location: [place] [facility];  Service: Neurosurgery;  Laterality: N/A;  Cervical Three-four,Cervical Four-Five,Cervical Five-Six,Anterior Cervical Decompression and Fusion with Nuvasive Plating  . arm surgery     at age 35;broke arm and had to go to the OR  . [place]  . COLONOSCOPY    . FINGER SURGERY  2012   right thumb   . SHOULDER SURGERY  04/12/2015    Current Medications: Outpatient Medications Prior to Visit  Medication Sig Dispense  Refill  . ibuprofen (ADVIL,MOTRIN) 800 MG tablet Take 800 mg by mouth as needed for mild pain or moderate pain.     . pantoprazole (PROTONIX) 40 MG tablet Take 40 mg by mouth daily.      Marland Kitchen aspirin EC 81 MG tablet Take 81 mg by mouth. Sometimes    . loratadine (CLARITIN) 10 MG tablet Take 10 mg by mouth as needed.    . rivaroxaban (XARELTO) 20 MG TABS tablet Take 1 tablet (20 mg total) by mouth daily with supper. (Patient not taking: Reported on 09/22/2015) 30 tablet 2  . sildenafil (REVATIO) 20 MG tablet Take 5 tablets by mouth as needed.    Alveda Reasons STARTER PACK 15 & 20 MG TBPK Take 1 tablet by mouth 2 (two) times daily.  0   No facility-administered medications prior to visit.      Allergies:    Niacin and related; Penicillins; and Sulfa antibiotics   Social History   Social History  . Marital status: Married    Spouse name: N/A  . Number of children: N/A  . Years of education: N/A   Social History Main Topics  . Smoking status: Former Smoker    Packs/day: 0.25    Years: 15.00  . Smokeless tobacco: None     Comment: quit 26yrs ago  . Alcohol use No  . Drug use: No  . Sexual activity: Yes   Other Topics Concern  . None   Social History Narrative  . None     Family History:  The patient's Father and brother with CAD.      ROS:   Please see the history of present illness.    ROS All other systems reviewed and are negative.   PHYSICAL EXAM:   VS:  BP (!) 150/80   Pulse (!) 58   Ht 6\' 1"  (1.854 m)   Wt 206 lb 6.4 oz (93.6 kg)   BMI 27.23 kg/m    GEN: Well nourished, well developed, in no acute distress  HEENT: normal  Neck: no JVD, carotid bruits, or masses Cardiac: RRR; no murmurs, rubs, or gallops,no edema  Respiratory:  clear to auscultation bilaterally, normal work of breathing GI: soft, nontender, nondistended, + BS MS: no deformity or atrophy  Skin: warm and dry, no rash Neuro:  Alert and Oriented x 3, Strength and sensation are intact Psych: euthymic mood, full affect  Wt Readings from Last 3 Encounters:  09/22/15 206 lb 6.4 oz (93.6 kg)  05/11/15 210 lb 1.6 oz (95.3 kg)  02/10/11 206 lb 5.6 oz (93.6 kg)      Studies/Labs Reviewed:   EKG:  EKG is ordered today.  The ekg ordered today demonstrates sinus brady HR 54, LAD   Recent Labs: 05/11/2015: BUN 15; Creat 0.97; Hemoglobin 16.3; Platelets 213; Potassium 4.2; Sodium 139   Lipid Panel No results found for: CHOL, TRIG, HDL, CHOLHDL, VLDL, LDLCALC, LDLDIRECT  Additional studies/ records that were reviewed today include:  LE dopplers 07/22/15: no DVT  ASSESSMENT & PLAN:   Throat pain and fatigue: ECG today with no acute ST or TW change. With family hx of CAD and symptoms similar to  brother's anginal equivalent, will order ett myoview.   Hx of DVT: completed 3 months of Xarelto. LE dopplers negative for DVT in 06/2015.   HTN: he was previously treated for HTN but then BP got too low and this was discontinued. He didn't feel good  when SBP would go under 115. BP  mildly elevated today at 150/80. He does not want to start anything for his BP right now. I have asked him to take his BP at home to make sure it is not higher than this.   Medication Adjustments/Labs and Tests Ordered: Current medicines are reviewed at length with the patient today.  Concerns regarding medicines are outlined above.  Medication changes, Labs and Tests ordered today are listed in the Patient Instructions below. Patient Instructions  Medication Instructions:  Your physician recommends that you continue on your current medications as directed. Please refer to the Current Medication list given to you today.   Labwork: None ordered  Testing/Procedures: Your physician has requested that you have en exercise stress myoview. For further information please visit [email or web address]. Please follow instruction sheet, as given.   Follow-Up: Your physician recommends that you schedule a follow-up appointment in: WILL BE BASED UPON YOUR TEST RESULTS  Any Other Special Instructions Will Be Listed Below (If Applicable).     If you need a refill on your cardiac medications before your next appointment, please call your pharmacy.      Signed, Angelena Form, PA-C  09/22/2015 11:18 AM    [facility] [address], [place], [place]  [address] Phone: [phone]; Fax: [phone]

## 2015-09-22 ENCOUNTER — Encounter: Payer: Self-pay | Admitting: Physician Assistant

## 2015-09-22 ENCOUNTER — Ambulatory Visit (INDEPENDENT_AMBULATORY_CARE_PROVIDER_SITE_OTHER): Payer: Medicare Other | Admitting: Physician Assistant

## 2015-09-22 VITALS — BP 150/80 | HR 58 | Ht 73.0 in | Wt 206.4 lb

## 2015-09-22 DIAGNOSIS — R079 Chest pain, unspecified: Secondary | ICD-10-CM | POA: Diagnosis not present

## 2015-09-22 DIAGNOSIS — R070 Pain in throat: Secondary | ICD-10-CM

## 2015-09-22 NOTE — Patient Instructions (Addendum)
Medication Instructions:  Your physician recommends that you continue on your current medications as directed. Please refer to the Current Medication list given to you today.  Labwork: None ordered  Testing/Procedures: Your physician has requested that you have en exercise stress myoview. For further information please visit [email or web address]. Please follow instruction sheet, as given.    Follow-Up: Your physician recommends that you schedule a follow-up appointment in: WILL BE BASED UPON YOUR TEST RESULTS   Any Other Special Instructions Will Be Listed Below (If Applicable).    If you need a refill on your cardiac medications before your next appointment, please call your pharmacy.   

## 2015-09-28 ENCOUNTER — Telehealth (HOSPITAL_COMMUNITY): Payer: Self-pay | Admitting: *Deleted

## 2015-09-28 NOTE — Telephone Encounter (Signed)
Left message on voicemail per DPR in reference to upcoming appointment scheduled on 10/01/15 at 0715 with detailed instructions given per Myocardial Perfusion Study Information Sheet for the test. LM to arrive 15 minutes early, and that it is imperative to arrive on time for appointment to keep from having the test rescheduled. If you need to cancel or reschedule your appointment, please call the office within 24 hours of your appointment. Failure to do so may result in a cancellation of your appointment, and a $50 no show fee. Phone number given for call back for any questions. Cariann Kinnamon, Ranae Palms

## 2015-10-01 ENCOUNTER — Encounter (HOSPITAL_COMMUNITY): Payer: Medicare Other

## 2015-10-01 ENCOUNTER — Ambulatory Visit (HOSPITAL_COMMUNITY): Payer: Medicare Other | Attending: Cardiology

## 2015-10-01 DIAGNOSIS — R079 Chest pain, unspecified: Secondary | ICD-10-CM | POA: Diagnosis not present

## 2015-10-01 DIAGNOSIS — Z8249 Family history of ischemic heart disease and other diseases of the circulatory system: Secondary | ICD-10-CM | POA: Insufficient documentation

## 2015-10-01 DIAGNOSIS — E119 Type 2 diabetes mellitus without complications: Secondary | ICD-10-CM | POA: Diagnosis not present

## 2015-10-01 DIAGNOSIS — R5383 Other fatigue: Secondary | ICD-10-CM | POA: Insufficient documentation

## 2015-10-01 DIAGNOSIS — R9439 Abnormal result of other cardiovascular function study: Secondary | ICD-10-CM | POA: Insufficient documentation

## 2015-10-01 LAB — MYOCARDIAL PERFUSION IMAGING
Estimated workload: 10.9 METS
Exercise duration (min): 9 min
Exercise duration (sec): 30 s
LV dias vol: 95 mL (ref 62–150)
LV sys vol: 33 mL
MPHR: 148 {beats}/min
Peak HR: 136 {beats}/min
Percent HR: 91 %
RATE: 0.32
Rest HR: 57 {beats}/min
SDS: 3
SRS: 2
SSS: 5
TID: 0.98

## 2015-10-01 MED ORDER — TECHNETIUM TC 99M TETROFOSMIN IV KIT
30.8000 | PACK | Freq: Once | INTRAVENOUS | Status: AC | PRN
  Administered 2015-10-01: 30.8 via INTRAVENOUS
  Filled 2015-10-01: qty 31

## 2015-10-01 MED ORDER — TECHNETIUM TC 99M TETROFOSMIN IV KIT
10.6000 | PACK | Freq: Once | INTRAVENOUS | Status: AC | PRN
  Administered 2015-10-01: 11 via INTRAVENOUS
  Filled 2015-10-01: qty 11

## 2015-12-17 DIAGNOSIS — Z23 Encounter for immunization: Secondary | ICD-10-CM | POA: Diagnosis not present

## 2016-01-24 DIAGNOSIS — R319 Hematuria, unspecified: Secondary | ICD-10-CM | POA: Diagnosis not present

## 2016-01-24 DIAGNOSIS — N529 Male erectile dysfunction, unspecified: Secondary | ICD-10-CM | POA: Diagnosis not present

## 2016-01-24 DIAGNOSIS — N401 Enlarged prostate with lower urinary tract symptoms: Secondary | ICD-10-CM | POA: Diagnosis not present

## 2016-01-24 DIAGNOSIS — N486 Induration penis plastica: Secondary | ICD-10-CM | POA: Diagnosis not present

## 2016-01-28 DIAGNOSIS — M25561 Pain in right knee: Secondary | ICD-10-CM | POA: Diagnosis not present

## 2016-01-28 DIAGNOSIS — M65331 Trigger finger, right middle finger: Secondary | ICD-10-CM | POA: Diagnosis not present

## 2016-03-03 ENCOUNTER — Telehealth: Payer: Self-pay

## 2016-03-06 ENCOUNTER — Ambulatory Visit: Payer: Medicare Other | Admitting: Family Medicine

## 2016-03-06 ENCOUNTER — Telehealth: Payer: Self-pay | Admitting: Family Medicine

## 2016-03-06 NOTE — Telephone Encounter (Signed)
Patient lvm cancelling he's 2pm new patient appointment due to work conflict, charge or no charge

## 2016-03-06 NOTE — Telephone Encounter (Signed)
charge 

## 2016-03-07 ENCOUNTER — Encounter: Payer: Self-pay | Admitting: Family Medicine

## 2016-03-13 NOTE — Telephone Encounter (Signed)
Ok, can waive fee this time

## 2016-03-13 NOTE — Telephone Encounter (Signed)
Patient informed. 

## 2016-03-13 NOTE — Telephone Encounter (Addendum)
Patient states he was unaware of the [id number] no show policy and would like fee waived, patient [facility] new patient appointment to 07/26/16 with PCP, please advise

## 2016-03-24 DIAGNOSIS — M65331 Trigger finger, right middle finger: Secondary | ICD-10-CM | POA: Diagnosis not present

## 2016-04-06 ENCOUNTER — Encounter (HOSPITAL_BASED_OUTPATIENT_CLINIC_OR_DEPARTMENT_OTHER): Admission: RE | Payer: Self-pay | Source: Ambulatory Visit

## 2016-04-06 ENCOUNTER — Ambulatory Visit (HOSPITAL_BASED_OUTPATIENT_CLINIC_OR_DEPARTMENT_OTHER): Admission: RE | Admit: 2016-04-06 | Payer: Medicare Other | Source: Ambulatory Visit | Admitting: Orthopedic Surgery

## 2016-04-06 SURGERY — RELEASE, A1 PULLEY, FOR TRIGGER FINGER
Anesthesia: Choice | Laterality: Right

## 2016-07-25 NOTE — Progress Notes (Signed)
[place] at [facility] [address] [place], [place] [address] [phone]  Date:  07/26/2016   Name:  KOKI BUXTON   DOB:  05/30/1945   MRN:  [id number]  PCP:  Darreld Mclean, MD    Chief Complaint: Establish Care (Pt here to est care. Would like labs and CPE. Last PSA 2016. )   History of Present Illness:  Randy Jensen is a 71 y.o. very pleasant male patient who presents with the following:  Here today as a new patient to my practice Needs a CPE today He lives in [place] and would like to establish care closer to his home  He is originally from [place], I take care of his brother.   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   He works a variable schedule- 20- 25 hours a week.    He has a history of hematuria since he was in HS- he has seen urology and all is benign He sees Dr. Elnoria Howard for his urology care.  I will check a PSA for him prior to his upcoming annual visit   He had a blood clot following a shoulder operation in 2017- he is off xarelto now. Clot thought to be provoked by surgery Although DVT is resolved, over the last year or so he has noted bilateral calf pain and aching with exercise.  It is consistently present with exercise, and resolves with rest.  Discussed with pt and he would like to have eval for claudication  He has GERD, uses occasional ibuprofen  He had been on BP medication in the past- however he stopped using this about 10 years ago.  He does check his home BP and will get maybe 145/82 on average. He really does not want to start back on any medication if he can possibly avoid it.   BP Readings from Last 3 Encounters:  07/26/16 (!) 162/82  09/22/15 (!) 150/80  05/11/15 138/84   Here today with his wife [name] He has 2 children, [name] has 1 child and 2 grands   This summer they will bre in town most of the time  He is not aware of having the prevnar 13 shot   Tetanus is UTD   He is not fasting today- he is due for labs however. He will come back for fasting labs   History of diet controlled pre-diabetes.    There are no active problems to display for this patient.   Past Medical History:  Diagnosis Date  . Arthritis   . Blood in urine    occ trace per pt   . Cataracts, bilateral   . Cervical spinal stenosis   . Diabetes mellitus    "borderline" doesn't require meds;diet and exercise;last  A1 C 6.2 a month ago  . Hiatal hernia    pt states that he takes Pantoprazole daily for hiatal hernia but denies GERD  . Hx of colonic polyps     Past Surgical History:  Procedure Laterality Date  . ANTERIOR CERVICAL DECOMP/DISCECTOMY FUSION  02/16/2011   Procedure: ANTERIOR CERVICAL DECOMPRESSION/DISCECTOMY FUSION 3 LEVELS;  Surgeon: Eustace Moore;  Location: [place] [facility];  Service: Neurosurgery;  Laterality: N/A;  Cervical Three-four,Cervical Four-Five,Cervical Five-Six,Anterior Cervical Decompression and Fusion with Nuvasive Plating  . arm surgery     at age 45;broke arm and had to go to the OR  . BACK SURGERY  [place]    . FINGER SURGERY  2012   right thumb   . SHOULDER SURGERY  04/12/2015    Social History  Substance Use Topics  . Smoking status: Former Smoker    Packs/day: 0.25    Years: 15.00  . Smokeless tobacco: Not on file     Comment: quit 70yrs ago  . Alcohol use No    Family History  Problem Relation Age of Onset  . Anesthesia problems Neg Hx   . Hypotension Neg Hx   . Malignant hyperthermia Neg Hx   . Pseudochol deficiency Neg Hx   . Heart disease Neg Hx     Allergies  Allergen Reactions  . Ciprofloxacin Hcl   . Niacin And Related Other (See Comments)    Blood pressure drops and passes out  . Penicillins Hives  . Sulfa Antibiotics Other (See Comments)    Katherina Right Syndrome    Medication list has been reviewed and updated.  Current Outpatient Prescriptions on File Prior to Visit  Medication  Sig Dispense Refill  . ibuprofen (ADVIL,MOTRIN) 800 MG tablet Take 800 mg by mouth as needed for mild pain or moderate pain.     . pantoprazole (PROTONIX) 40 MG tablet Take 40 mg by mouth daily.       No current facility-administered medications on file prior to visit.     Review of Systems:  As per HPI- otherwise negative. No fever or chills No CP or SOB with exercise No rash   Physical Examination: Vitals:   07/26/16 1325  BP: (!) 162/82  Pulse: 71  Temp: 98.1 F (36.7 C)   Vitals:   07/26/16 1325  Weight: 208 lb (94.3 kg)  Height: 6\' 1"  (1.854 m)   Body mass index is 27.44 kg/m. Ideal Body Weight: Weight in (lb) to have BMI = 25: 189.1  GEN: WDWN, NAD, Non-toxic, A & O x 3 HEENT: Atraumatic, Normocephalic. Neck supple. No masses, No LAD. Ears and Nose: No external deformity. CV: RRR, No M/G/R. No JVD. No thrill. No extra heart sounds. PULM: CTA B, no wheezes, crackles, rhonchi. No retractions. No resp. distress. No accessory muscle use. ABD: S, NT, ND, +BS. No rebound. No HSM. EXTR: No c/c/e NEURO Normal gait.  PSYCH: Normally interactive. Conversant. Not depressed or anxious appearing.  Calm demeanor.  Normal weight, looks well  Assessment and Plan: Essential hypertension - Plan: Lipid panel  Immunization due - Plan: Pneumococcal conjugate vaccine 13-valent IM  Pre-diabetes - Plan: Comprehensive metabolic panel, Hemoglobin A1c  Benign prostatic hyperplasia, unspecified whether lower urinary tract symptoms present - Plan: PSA  Encounter for hepatitis C screening test for low risk patient - Plan: Hepatitis C antibody  Screening for hyperlipidemia - Plan: Lipid panel  Screening for deficiency anemia - Plan: CBC  Medication monitoring encounter - Plan: CBC, Lipid panel  History of tobacco abuse - Plan: Lipid panel  Claudication ([employer]) - Plan: VAS [place] ABI WITH/WO TBI, VAS [place] LOWER EXTREMITY ARTERIAL DUPLEX  eval as above- labs ordered for fasting Update  prevnar today Recommended shingles vaccine and he will think about this eval for claudication with ABI Borderline BP- continue to monitor. He does not wish to take medication unless absolutely necessary   Signed Lamar Blinks, MD

## 2016-07-26 ENCOUNTER — Ambulatory Visit (INDEPENDENT_AMBULATORY_CARE_PROVIDER_SITE_OTHER): Payer: Medicare Other | Admitting: Family Medicine

## 2016-07-26 ENCOUNTER — Encounter: Payer: Self-pay | Admitting: Behavioral Health

## 2016-07-26 ENCOUNTER — Telehealth: Payer: Self-pay | Admitting: Behavioral Health

## 2016-07-26 VITALS — BP 132/85 | HR 71 | Temp 98.1°F | Ht 73.0 in | Wt 208.0 lb

## 2016-07-26 DIAGNOSIS — I739 Peripheral vascular disease, unspecified: Secondary | ICD-10-CM

## 2016-07-26 DIAGNOSIS — Z5181 Encounter for therapeutic drug level monitoring: Secondary | ICD-10-CM

## 2016-07-26 DIAGNOSIS — Z87891 Personal history of nicotine dependence: Secondary | ICD-10-CM

## 2016-07-26 DIAGNOSIS — N400 Enlarged prostate without lower urinary tract symptoms: Secondary | ICD-10-CM | POA: Diagnosis not present

## 2016-07-26 DIAGNOSIS — R7303 Prediabetes: Secondary | ICD-10-CM

## 2016-07-26 DIAGNOSIS — Z23 Encounter for immunization: Secondary | ICD-10-CM | POA: Diagnosis not present

## 2016-07-26 DIAGNOSIS — Z1159 Encounter for screening for other viral diseases: Secondary | ICD-10-CM

## 2016-07-26 DIAGNOSIS — Z13220 Encounter for screening for lipoid disorders: Secondary | ICD-10-CM | POA: Diagnosis not present

## 2016-07-26 DIAGNOSIS — Z130 Encounter for screening for diseases of the blood and blood-forming organs and certain disorders involving the immune mechanism: Secondary | ICD-10-CM

## 2016-07-26 DIAGNOSIS — I10 Essential (primary) hypertension: Secondary | ICD-10-CM | POA: Diagnosis not present

## 2016-07-26 NOTE — Patient Instructions (Signed)
It was nice to see you today!  Please come in for fasting labs at your convenience  You got your prenvar 13 pneumonia booster today  I will be in touch with your labs asap

## 2016-07-26 NOTE — Telephone Encounter (Signed)
Pre-Visit Call completed with patient and chart updated.   Pre-Visit Info documented in Specialty Comments under Snap[employer]ot.    

## 2016-07-28 ENCOUNTER — Other Ambulatory Visit (INDEPENDENT_AMBULATORY_CARE_PROVIDER_SITE_OTHER): Payer: Medicare Other

## 2016-07-28 DIAGNOSIS — Z130 Encounter for screening for diseases of the blood and blood-forming organs and certain disorders involving the immune mechanism: Secondary | ICD-10-CM | POA: Diagnosis not present

## 2016-07-28 DIAGNOSIS — Z1159 Encounter for screening for other viral diseases: Secondary | ICD-10-CM

## 2016-07-28 DIAGNOSIS — I10 Essential (primary) hypertension: Secondary | ICD-10-CM

## 2016-07-28 DIAGNOSIS — Z87891 Personal history of nicotine dependence: Secondary | ICD-10-CM

## 2016-07-28 DIAGNOSIS — Z5181 Encounter for therapeutic drug level monitoring: Secondary | ICD-10-CM

## 2016-07-28 DIAGNOSIS — Z13220 Encounter for screening for lipoid disorders: Secondary | ICD-10-CM

## 2016-07-28 DIAGNOSIS — N400 Enlarged prostate without lower urinary tract symptoms: Secondary | ICD-10-CM | POA: Diagnosis not present

## 2016-07-28 DIAGNOSIS — R7303 Prediabetes: Secondary | ICD-10-CM

## 2016-07-28 LAB — CBC
HCT: 47.4 % (ref 39.0–52.0)
Hemoglobin: 16.1 g/dL (ref 13.0–17.0)
MCHC: 34 g/dL (ref 30.0–36.0)
MCV: 93.3 fl (ref 78.0–100.0)
Platelets: 192 10*3/uL (ref 150.0–400.0)
RBC: 5.08 Mil/uL (ref 4.22–5.81)
RDW: 13.3 % (ref 11.5–15.5)
WBC: 8.2 10*3/uL (ref 4.0–10.5)

## 2016-07-28 LAB — COMPREHENSIVE METABOLIC PANEL
ALT: 10 U/L (ref 0–53)
AST: 14 U/L (ref 0–37)
Albumin: 4.1 g/dL (ref 3.5–5.2)
Alkaline Phosphatase: 58 U/L (ref 39–117)
BUN: 21 mg/dL (ref 6–23)
CO2: 25 mEq/L (ref 19–32)
Calcium: 9 mg/dL (ref 8.4–10.5)
Chloride: 107 mEq/L (ref 96–112)
Creatinine, Ser: 1.05 mg/dL (ref 0.40–1.50)
GFR: 73.54 mL/min (ref >60.00)
Glucose, Bld: 123 mg/dL — ABNORMAL HIGH (ref 70–99)
Potassium: 4.2 mEq/L (ref 3.5–5.1)
Sodium: 140 mEq/L (ref 135–145)
Total Bilirubin: 1.1 mg/dL (ref 0.2–1.2)
Total Protein: 6.7 g/dL (ref 6.0–8.3)

## 2016-07-28 LAB — LIPID PANEL
Cholesterol: 156 mg/dL (ref 0–200)
HDL: 27.9 mg/dL — ABNORMAL LOW (ref >39.00)
LDL Cholesterol: 100 mg/dL — ABNORMAL HIGH (ref 0–99)
NonHDL: 127.65
Total CHOL/HDL Ratio: 6
Triglycerides: 137 mg/dL (ref 0.0–149.0)
VLDL: 27.4 mg/dL (ref 0.0–40.0)

## 2016-07-28 LAB — HEMOGLOBIN A1C: Hgb A1c MFr Bld: 6.6 % — ABNORMAL HIGH (ref 4.6–6.5)

## 2016-07-28 LAB — HEPATITIS C ANTIBODY: HCV Ab: NEGATIVE

## 2016-07-28 LAB — PSA: PSA: 0.98 ng/mL (ref 0.10–4.00)

## 2016-08-21 NOTE — Telephone Encounter (Signed)
Completed.

## 2016-08-25 ENCOUNTER — Telehealth: Payer: Self-pay | Admitting: Family Medicine

## 2016-08-25 ENCOUNTER — Other Ambulatory Visit: Payer: Self-pay | Admitting: Emergency Medicine

## 2016-08-25 MED ORDER — GLUCOSE BLOOD VI STRP: ORAL_STRIP | 3 refills | Status: DC

## 2016-08-25 MED ORDER — PANTOPRAZOLE SODIUM 40 MG PO TBEC: 40.0000 mg | DELAYED_RELEASE_TABLET | Freq: Every day | ORAL | 3 refills | Status: DC

## 2016-08-25 NOTE — Telephone Encounter (Signed)
Caller name:Lamoyne Willette Relationship to patient: Can be reached:[phone] Pharmacy: [facility]  Reason for call:Requesting refill on pantoprazole for 1 year, glucose test strips(has free style lite) 50 with 3 refills

## 2016-08-25 NOTE — Telephone Encounter (Signed)
Refills sent to [facility] as requested.

## 2016-09-04 ENCOUNTER — Encounter (HOSPITAL_COMMUNITY): Payer: Medicare Other

## 2016-09-12 ENCOUNTER — Ambulatory Visit (HOSPITAL_COMMUNITY)
Admission: RE | Admit: 2016-09-12 | Discharge: 2016-09-12 | Disposition: A | Discharge status: Home / Self Care | Payer: Medicare Other | Source: Ambulatory Visit | Attending: Family Medicine | Admitting: Family Medicine

## 2016-09-12 ENCOUNTER — Ambulatory Visit (INDEPENDENT_AMBULATORY_CARE_PROVIDER_SITE_OTHER)
Admission: RE | Admit: 2016-09-12 | Discharge: 2016-09-12 | Disposition: A | Discharge status: Home / Self Care | Payer: Medicare Other | Source: Ambulatory Visit | Attending: Family Medicine | Admitting: Family Medicine

## 2016-09-12 DIAGNOSIS — I739 Peripheral vascular disease, unspecified: Secondary | ICD-10-CM

## 2016-09-15 ENCOUNTER — Encounter: Payer: Self-pay | Admitting: Family Medicine

## 2016-09-27 DIAGNOSIS — M25561 Pain in right knee: Secondary | ICD-10-CM | POA: Diagnosis not present

## 2016-10-10 DIAGNOSIS — H838X3 Other specified diseases of inner ear, bilateral: Secondary | ICD-10-CM | POA: Diagnosis not present

## 2016-10-10 DIAGNOSIS — H903 Sensorineural hearing loss, bilateral: Secondary | ICD-10-CM | POA: Diagnosis not present

## 2017-01-05 ENCOUNTER — Ambulatory Visit (INDEPENDENT_AMBULATORY_CARE_PROVIDER_SITE_OTHER): Payer: Medicare Other

## 2017-01-05 ENCOUNTER — Other Ambulatory Visit: Payer: Self-pay | Admitting: Family Medicine

## 2017-01-05 DIAGNOSIS — Z23 Encounter for immunization: Secondary | ICD-10-CM

## 2017-01-05 NOTE — Telephone Encounter (Signed)
Caller name: Va[place]  Relation to pt: self  Call back number: [phone] Pharmacy: [facility] [address] [place]  Reason for call: Pt is requesting refill on pantoprazole (PROTONIX) 40 MG tablet for 1 year and needing ibuprofen (ADVIL,MOTRIN) 800 MG tablet refill for 6 months, pt stated that he does need the pantoprazole (PROTONIX) 40 MG tablet rx for a year since his insurance will not cover completely the rx unless it is for a year (it is cheaper for the pt to get it this way)Pt requested the last time his rx for a year and it was only for 3 month and pt ended paying for the rx.  Please have this rx for a year and the ibuprofen for 6 months. Please advise.

## 2017-01-08 ENCOUNTER — Other Ambulatory Visit: Payer: Self-pay | Admitting: Emergency Medicine

## 2017-01-08 MED ORDER — PANTOPRAZOLE SODIUM 40 MG PO TBEC: 40.0000 mg | DELAYED_RELEASE_TABLET | Freq: Every day | ORAL | 3 refills | Status: DC

## 2017-01-08 MED ORDER — IBUPROFEN 800 MG PO TABS: 800.0000 mg | ORAL_TABLET | Freq: Three times a day (TID) | ORAL | 6 refills | Status: DC | PRN

## 2017-04-11 DIAGNOSIS — H52221 Regular astigmatism, right eye: Secondary | ICD-10-CM | POA: Diagnosis not present

## 2017-04-11 DIAGNOSIS — D3131 Benign neoplasm of right choroid: Secondary | ICD-10-CM | POA: Diagnosis not present

## 2017-05-31 DIAGNOSIS — R3912 Poor urinary stream: Secondary | ICD-10-CM | POA: Diagnosis not present

## 2017-06-13 ENCOUNTER — Telehealth: Payer: Self-pay | Admitting: Emergency Medicine

## 2017-06-13 DIAGNOSIS — Z13220 Encounter for screening for lipoid disorders: Secondary | ICD-10-CM

## 2017-06-13 DIAGNOSIS — R7303 Prediabetes: Secondary | ICD-10-CM

## 2017-06-13 DIAGNOSIS — N400 Enlarged prostate without lower urinary tract symptoms: Secondary | ICD-10-CM

## 2017-06-13 DIAGNOSIS — I10 Essential (primary) hypertension: Secondary | ICD-10-CM

## 2017-06-13 NOTE — Telephone Encounter (Signed)
Copied from [facility] [id number]. Topic: Inquiry >> Jun 08, 2017  4:59 PM Randy Jensen B wrote: Reason for CRM: pt would liked to be called to make lab orders so that he can go over the results for his 4.18.19 appt, contact pt to advise

## 2017-06-13 NOTE — Telephone Encounter (Signed)
Called pt to inform that Dr. Lorelei Pont has already ordered labs and that he could come today to get labs done prior to CPE 06/14/17. Pt states that he can't come until 4:30 today so he'd rather just get it done tomorrow when he comes for his 10am appt.

## 2017-06-13 NOTE — Telephone Encounter (Signed)
Please call him- I did place lab orders for him.  I am so sorry, this message was just sent to [place] today which is why I did not contact him sooner

## 2017-06-13 NOTE — Progress Notes (Deleted)
[place] at [facility] [address], [place], [place] [address] [phone]  Date:  06/14/2017   Name:  Randy Jensen   DOB:  [birth year]   MRN:  [id number]  PCP:  Randy Mclean, MD    Chief Complaint: No chief complaint on file.   History of Present Illness:  Randy Jensen is a 72 y.o. very pleasant male patient who presents with the following:  Last seen by myself about a year ago:  Here today as a new patient to my practice Needs a CPE today He lives in [place] and would like to establish care closer to his home He is originally from [place], I take care of his brother.   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   He works a variable schedule- 20- 25 hours a week.   He has a history of hematuria since he was in HS- he has seen urology and all is benigne sees Dr. Elnoria Jensen for his urology care.  I will check a PSA for him prior to his upcoming annual visit  He had a blood clot following a shoulder operation in 2017- he is off xarelto now. Clot thought to be provoked by surgery Although DVT is resolved, over the last year or so he has noted bilateral calf pain and aching with exercise.  It is consistently present with exercise, and resolves with rest.  Discussed with pt and he would like to have eval for claudication He has GERD, uses occasional ibuprofen He had been on BP medication in the past- however he stopped using this about 10 years ago.  He does check his home BP and will get maybe 145/82 on average. He really does not want to start back on any medication if he can possibly avoid it.      BP Readings from Last 3 Encounters:  07/26/16 (!) 162/82  09/22/15 (!) 150/80  05/11/15 138/84   Here today with his wife Randy Jensen He has 2 children, Randy Jensen has 1 child and 2 grands  He is not fasting today- he is due for labs however. He will come back for fasting labs  History of diet controlled  pre-diabetes.               There are no active problems to display for this patient.   Past Medical History:  Diagnosis Date  . Arthritis   . Blood in urine    occ trace per pt   . Cataracts, bilateral   . Cervical spinal stenosis   . Diabetes mellitus    "borderline" doesn't require meds;diet and exercise;last  A1 C 6.2 a month ago  . Hiatal hernia    pt states that he takes Pantoprazole daily for hiatal hernia but denies GERD  . Hx of colonic polyps     Past Surgical History:  Procedure Laterality Date  . ANTERIOR CERVICAL DECOMP/DISCECTOMY FUSION  02/16/2011   Procedure: ANTERIOR CERVICAL DECOMPRESSION/DISCECTOMY FUSION 3 LEVELS;  Surgeon: Eustace Moore;  Location: [place] [facility];  Service: Neurosurgery;  Laterality: N/A;  Cervical Three-four,Cervical Four-Five,Cervical Five-Six,Anterior Cervical Decompression and Fusion with Nuvasive Plating  . arm surgery     at age 72;broke arm and had to go to the OR  . [place]  . COLONOSCOPY    . FINGER SURGERY  2012   right thumb   . SHOULDER SURGERY  04/12/2015  Social History   Tobacco Use  . Smoking status: Former Smoker    Packs/day: 0.25    Years: 15.00    Pack years: 3.75  . Tobacco comment: quit 42yrs ago  Substance Use Topics  . Alcohol use: No  . Drug use: No    Family History  Problem Relation Age of Onset  . Anesthesia problems Neg Hx   . Hypotension Neg Hx   . Malignant hyperthermia Neg Hx   . Pseudochol deficiency Neg Hx   . Heart disease Neg Hx     Allergies  Allergen Reactions  . Ciprofloxacin Hcl   . Niacin And Related Other (See Comments)    Blood pressure drops and passes out  . Penicillins Hives  . Sulfa Antibiotics Other (See Comments)    Katherina Right Syndrome    Medication list has been reviewed and updated.  Current Outpatient Medications on File Prior to Visit  Medication Sig Dispense Refill  . glucose blood (FREESTYLE LITE) test strip Use as instructed 50 each  3  . ibuprofen (ADVIL,MOTRIN) 800 MG tablet Take 800 mg by mouth as needed for mild pain or moderate pain.     Marland Kitchen ibuprofen (ADVIL,MOTRIN) 800 MG tablet Take 1 tablet (800 mg total) every 8 (eight) hours as needed by mouth. 30 tablet 6  . pantoprazole (PROTONIX) 40 MG tablet Take 1 tablet (40 mg total) by mouth daily. 90 tablet 3  . pantoprazole (PROTONIX) 40 MG tablet Take 1 tablet (40 mg total) daily by mouth. 90 tablet 3   No current facility-administered medications on file prior to visit.     Review of Systems:  ***  Physical Examination: There were no vitals filed for this visit. There were no vitals filed for this visit. There is no height or weight on file to calculate BMI. Ideal Body Weight:    ***  Assessment and Plan: ***  Signed Lamar Blinks, MD

## 2017-06-14 ENCOUNTER — Ambulatory Visit: Payer: Medicare Other | Admitting: Family Medicine

## 2017-06-14 DIAGNOSIS — Z0289 Encounter for other administrative examinations: Secondary | ICD-10-CM

## 2017-06-18 ENCOUNTER — Other Ambulatory Visit (INDEPENDENT_AMBULATORY_CARE_PROVIDER_SITE_OTHER): Payer: Medicare Other

## 2017-06-18 DIAGNOSIS — I10 Essential (primary) hypertension: Secondary | ICD-10-CM | POA: Diagnosis not present

## 2017-06-18 DIAGNOSIS — N400 Enlarged prostate without lower urinary tract symptoms: Secondary | ICD-10-CM | POA: Diagnosis not present

## 2017-06-18 DIAGNOSIS — Z13220 Encounter for screening for lipoid disorders: Secondary | ICD-10-CM

## 2017-06-18 DIAGNOSIS — R7303 Prediabetes: Secondary | ICD-10-CM

## 2017-06-18 LAB — CBC
HCT: 48.2 % (ref 39.0–52.0)
Hemoglobin: 16.5 g/dL (ref 13.0–17.0)
MCHC: 34.3 g/dL (ref 30.0–36.0)
MCV: 93.2 fl (ref 78.0–100.0)
Platelets: 184 10*3/uL (ref 150.0–400.0)
RBC: 5.17 Mil/uL (ref 4.22–5.81)
RDW: 13.7 % (ref 11.5–15.5)
WBC: 7.6 10*3/uL (ref 4.0–10.5)

## 2017-06-18 LAB — COMPREHENSIVE METABOLIC PANEL
ALT: 12 U/L (ref 0–53)
AST: 15 U/L (ref 0–37)
Albumin: 4.1 g/dL (ref 3.5–5.2)
Alkaline Phosphatase: 60 U/L (ref 39–117)
BUN: 13 mg/dL (ref 6–23)
CO2: 28 mEq/L (ref 19–32)
Calcium: 9.4 mg/dL (ref 8.4–10.5)
Chloride: 103 mEq/L (ref 96–112)
Creatinine, Ser: 1.05 mg/dL (ref 0.40–1.50)
GFR: 73.36 mL/min (ref >60.00)
Glucose, Bld: 121 mg/dL — ABNORMAL HIGH (ref 70–99)
Potassium: 4.5 mEq/L (ref 3.5–5.1)
Sodium: 142 mEq/L (ref 135–145)
Total Bilirubin: 1 mg/dL (ref 0.2–1.2)
Total Protein: 6.7 g/dL (ref 6.0–8.3)

## 2017-06-18 LAB — LIPID PANEL
Cholesterol: 172 mg/dL (ref 0–200)
HDL: 32.2 mg/dL — ABNORMAL LOW (ref >39.00)
LDL Cholesterol: 113 mg/dL — ABNORMAL HIGH (ref 0–99)
NonHDL: 139.75
Total CHOL/HDL Ratio: 5
Triglycerides: 135 mg/dL (ref 0.0–149.0)
VLDL: 27 mg/dL (ref 0.0–40.0)

## 2017-06-18 LAB — HEMOGLOBIN A1C: Hgb A1c MFr Bld: 6.7 % — ABNORMAL HIGH (ref 4.6–6.5)

## 2017-06-18 LAB — PSA: PSA: 1.11 ng/mL (ref 0.10–4.00)

## 2017-06-18 NOTE — Progress Notes (Signed)
[place] at [facility] [address], [place], [place] [address] [phone]  Date:  06/20/2017   Name:  Randy Jensen   DOB:  [birth year]   MRN:  [id number]  PCP:  Darreld Mclean, MD    Chief Complaint: Follow-up (Pt here for f/u visit. )   History of Present Illness:  Randy Jensen is a 72 y.o. very pleasant male patient who presents with the following:  Follow up visit today He came in for labs earlier this week so we can discuss them today History of diabetes, hiatial hernia  He has had 2 A1c over 6.5% so he does qualify as a diabetic although he is not on medication for same   Lab Results  Component Value Date   PSA 1.11 06/18/2017   PSA 0.98 07/28/2016   PSA with Dr. Doyle Askew; normal; per pt. 05/29/2015   Shingrix:he has not had yet, suggested that he have done at his pharmacy He is overall feeling ok, his only real concern today is seasonal allergies.  He has noted some allergy sx with nasal congestion at night.  He is using an OTC nasal decongestant   He was on BP medication for many years, but he stopped using this years ago as it made his BP run too low He does have a BP cuff at home although he has not been using it much recently  BP Readings from Last 3 Encounters:  06/20/17 (!) 152/90  07/26/16 132/85  09/22/15 (!) 150/80   Notes that he does tend to have body aches and pains, esp if he is playing golf or working outdoors.  He will take ibuprofen 800 mg to deal with this pain and it does work He has a history of cervical spine stenosis and underwent a decompression and fusion in 2012.  He continues to have some numbness and lack of ROM in his right arm since he was dx with the stenosis   He also mentions a LE vascular study that I got for him last year- he was unsure of follow- up.  Looking back in the chart I wrote the following message on 09/15/16, which was read by the pt:  I got your vascular studies from  7/17-  Your ankle brachial indices (comparinson of blood pressure between your leg and your arm)were normal  However they did see a possible occlusion of one artery in your left leg on ultrasound evaluation.  It is possible that this may be contributing to your leg pain and fatigue with exercise. I would suggest that we refer you to vascular surgery for their opinion. Would you like me to place a referral?  You can reply here!   I do not see where he replied to my message   There are no active problems to display for this patient.   Past Medical History:  Diagnosis Date  . Arthritis   . Blood in urine    occ trace per pt   . Cataracts, bilateral   . Cervical spinal stenosis   . Diabetes mellitus    "borderline" doesn't require meds;diet and exercise;last  A1 C 6.2 a month ago  . Hiatal hernia    pt states that he takes Pantoprazole daily for hiatal hernia but denies GERD  . Hx of colonic polyps     Past Surgical History:  Procedure Laterality Date  . ANTERIOR CERVICAL DECOMP/DISCECTOMY FUSION  02/16/2011   Procedure: ANTERIOR  CERVICAL DECOMPRESSION/DISCECTOMY FUSION 3 LEVELS;  Surgeon: Eustace Moore;  Location: [place] [facility];  Service: Neurosurgery;  Laterality: N/A;  Cervical Three-four,Cervical Four-Five,Cervical Five-Six,Anterior Cervical Decompression and Fusion with Nuvasive Plating  . arm surgery     at age 8;broke arm and had to go to the OR  . [place]  . COLONOSCOPY    . FINGER SURGERY  2012   right thumb   . SHOULDER SURGERY  04/12/2015    Social History   Tobacco Use  . Smoking status: Former Smoker    Packs/day: 0.25    Years: 15.00    Pack years: 3.75  . Tobacco comment: quit 72yrs ago  Substance Use Topics  . Alcohol use: No  . Drug use: No    Family History  Problem Relation Age of Onset  . Anesthesia problems Neg Hx   . Hypotension Neg Hx   . Malignant hyperthermia Neg Hx   . Pseudochol deficiency Neg Hx   . Heart disease Neg Hx      Allergies  Allergen Reactions  . Ciprofloxacin Hcl   . Niacin And Related Other (See Comments)    Blood pressure drops and passes out  . Penicillins Hives  . Sulfa Antibiotics Other (See Comments)    Katherina Right Syndrome    Medication list has been reviewed and updated.  Current Outpatient Medications on File Prior to Visit  Medication Sig Dispense Refill  . alfuzosin (UROXATRAL) 10 MG 24 hr tablet Take 1 tablet by mouth daily.    Marland Kitchen glucose blood (FREESTYLE LITE) test strip Use as instructed 50 each 3  . ibuprofen (ADVIL,MOTRIN) 800 MG tablet Take 800 mg by mouth as needed for mild pain or moderate pain.     Marland Kitchen ibuprofen (ADVIL,MOTRIN) 800 MG tablet Take 1 tablet (800 mg total) every 8 (eight) hours as needed by mouth. 30 tablet 6  . pantoprazole (PROTONIX) 40 MG tablet Take 1 tablet (40 mg total) by mouth daily. 90 tablet 3  . pantoprazole (PROTONIX) 40 MG tablet Take 1 tablet (40 mg total) daily by mouth. 90 tablet 3   No current facility-administered medications on file prior to visit.     Review of Systems:  As per HPI- otherwise negative. No fever or chills No CP or SOB He is walking some for exercise    Physical Examination: Vitals:   06/20/17 0951 06/20/17 1001  BP: (!) 154/93 (!) 152/90  Pulse: (!) 56   Temp: 97.8 F (36.6 C)   SpO2: 97%    Vitals:   06/20/17 0951  Weight: 207 lb 9.6 oz (94.2 kg)  Height: 6\' 1"  (1.854 m)   Body mass index is 27.39 kg/m. Ideal Body Weight: Weight in (lb) to have BMI = 25: 189.1  GEN: WDWN, NAD, Non-toxic, A & O x 3, looks well, normal weight HEENT: Atraumatic, Normocephalic. Neck supple. No masses, No LAD.  Bilateral TM wnl, oropharynx normal.  PEERL,EOMI.   Ears and Nose: No external deformity. CV: RRR, No M/G/R. No JVD. No thrill. No extra heart sounds. PULM: CTA B, no wheezes, crackles, rhonchi. No retractions. No resp. distress. No accessory muscle use. ABD: S, NT, ND, +BS. No rebound. No HSM. EXTR: No  c/c/e NEURO Normal gait.  PSYCH: Normally interactive. Conversant. Not depressed or anxious appearing.  Calm demeanor.    Assessment and Plan: Benign prostatic hyperplasia, unspecified whether lower urinary tract symptoms present  Essential hypertension  Dyslipidemia  Diabetes mellitus without complication ([place])  Immunization due  Went over his labs today, put more details into a mychart message for him Asked him to monitor his BP as it is running high- he agrees to do so and will let me know if running higher than 140/90   Signed Lamar Blinks, MD  Results for orders placed or performed in visit on 06/18/17  PSA  Result Value Ref Range   PSA 1.11 0.10 - 4.00 ng/mL  Lipid panel  Result Value Ref Range   Cholesterol 172 0 - 200 mg/dL   Triglycerides 135.0 0.0 - 149.0 mg/dL   HDL 32.20 (L) >39.00 mg/dL   VLDL 27.0 0.0 - 40.0 mg/dL   LDL Cholesterol 113 (H) 0 - 99 mg/dL   Total CHOL/HDL Ratio 5    NonHDL 139.75   Hemoglobin A1c  Result Value Ref Range   Hgb A1c MFr Bld 6.7 (H) 4.6 - 6.5 %  CBC  Result Value Ref Range   WBC 7.6 4.0 - 10.5 K/uL   RBC 5.17 4.22 - 5.81 Mil/uL   Platelets 184.0 150.0 - 400.0 K/uL   Hemoglobin 16.5 13.0 - 17.0 g/dL   HCT 48.2 39.0 - 52.0 %   MCV 93.2 78.0 - 100.0 fl   MCHC 34.3 30.0 - 36.0 g/dL   RDW 13.7 11.5 - 15.5 %  Comprehensive metabolic panel  Result Value Ref Range   Sodium 142 135 - 145 mEq/L   Potassium 4.5 3.5 - 5.1 mEq/L   Chloride 103 96 - 112 mEq/L   CO2 28 19 - 32 mEq/L   Glucose, Bld 121 (H) 70 - 99 mg/dL   BUN 13 6 - 23 mg/dL   Creatinine, Ser 1.05 0.40 - 1.50 mg/dL   Total Bilirubin 1.0 0.2 - 1.2 mg/dL   Alkaline Phosphatase 60 39 - 117 U/L   AST 15 0 - 37 U/L   ALT 12 0 - 53 U/L   Total Protein 6.7 6.0 - 8.3 g/dL   Albumin 4.1 3.5 - 5.2 g/dL   Calcium 9.4 8.4 - 10.5 mg/dL   GFR 73.36 >60.00 mL/min

## 2017-06-20 ENCOUNTER — Ambulatory Visit (INDEPENDENT_AMBULATORY_CARE_PROVIDER_SITE_OTHER): Payer: Medicare Other | Admitting: Family Medicine

## 2017-06-20 ENCOUNTER — Encounter: Payer: Self-pay | Admitting: Family Medicine

## 2017-06-20 VITALS — BP 152/90 | HR 56 | Temp 97.8°F | Ht 73.0 in | Wt 207.6 lb

## 2017-06-20 DIAGNOSIS — N400 Enlarged prostate without lower urinary tract symptoms: Secondary | ICD-10-CM | POA: Diagnosis not present

## 2017-06-20 DIAGNOSIS — E119 Type 2 diabetes mellitus without complications: Secondary | ICD-10-CM | POA: Diagnosis not present

## 2017-06-20 DIAGNOSIS — I10 Essential (primary) hypertension: Secondary | ICD-10-CM | POA: Diagnosis not present

## 2017-06-20 DIAGNOSIS — E785 Hyperlipidemia, unspecified: Secondary | ICD-10-CM

## 2017-06-20 DIAGNOSIS — Z23 Encounter for immunization: Secondary | ICD-10-CM

## 2017-06-20 NOTE — Patient Instructions (Addendum)
You might want to get the Shingrix vaccine at your drug store to help prevent shingles Your HDL (good cholesterol) is low- I would suggest an OTC omega 3 supplement to help raise your HDL level  Be cautious of using OTC nasal decongestants as they can eventually make your nasal congestion worse.  A nasal steroid such as flonase may be a better choice   Your A1c is in the low diabetes range- we will continue to monitor this  Please check your BP at home for me a few times over the next month.  If running higher than 140/90 on a regular basis please do alert me   Let's plan to visit in 6 months and take care

## 2017-06-21 DIAGNOSIS — M25561 Pain in right knee: Secondary | ICD-10-CM | POA: Diagnosis not present

## 2017-07-04 ENCOUNTER — Other Ambulatory Visit: Payer: Self-pay | Admitting: Family Medicine

## 2017-08-24 DIAGNOSIS — L905 Scar conditions and fibrosis of skin: Secondary | ICD-10-CM | POA: Diagnosis not present

## 2017-08-24 DIAGNOSIS — Z85828 Personal history of other malignant neoplasm of skin: Secondary | ICD-10-CM | POA: Diagnosis not present

## 2017-08-24 DIAGNOSIS — L570 Actinic keratosis: Secondary | ICD-10-CM | POA: Diagnosis not present

## 2017-10-01 ENCOUNTER — Other Ambulatory Visit: Payer: Self-pay

## 2017-10-19 DIAGNOSIS — L570 Actinic keratosis: Secondary | ICD-10-CM | POA: Diagnosis not present

## 2017-10-19 DIAGNOSIS — D229 Melanocytic nevi, unspecified: Secondary | ICD-10-CM | POA: Diagnosis not present

## 2017-10-19 DIAGNOSIS — Z85828 Personal history of other malignant neoplasm of skin: Secondary | ICD-10-CM | POA: Diagnosis not present

## 2017-10-19 DIAGNOSIS — L578 Other skin changes due to chronic exposure to nonionizing radiation: Secondary | ICD-10-CM | POA: Diagnosis not present

## 2017-10-19 DIAGNOSIS — L814 Other melanin hyperpigmentation: Secondary | ICD-10-CM | POA: Diagnosis not present

## 2017-10-19 DIAGNOSIS — L821 Other seborrheic keratosis: Secondary | ICD-10-CM | POA: Diagnosis not present

## 2017-12-14 DIAGNOSIS — M25561 Pain in right knee: Secondary | ICD-10-CM | POA: Diagnosis not present

## 2017-12-16 DIAGNOSIS — M25511 Pain in right shoulder: Secondary | ICD-10-CM | POA: Diagnosis not present

## 2017-12-16 NOTE — Progress Notes (Signed)
[facility] at [facility] [address] [place], [place] [address] [phone]  Date:  12/20/2017   Name:  Randy Jensen   DOB:  08/07/1945   MRN:  [id number]  PCP:  Darreld Mclean, MD    Chief Complaint: Hypertension (6 month follow up) and Diabetes   History of Present Illness:  Randy Jensen is a 72 y.o. very pleasant male patient who presents with the following:  Periodic follow-up visit today history of DM and HTN Last seen here in April at which time he was concerned about some leg symptoms.  See lab notes from that visit as follows:  As we discussed, your HDL (good cholesterol) is low- an omega 3 supplement may help to raise this number  Your A1c is ok- in the diabetes range but not out of control  Blood counts are normal  Metabolic profile is normal.    I had some time to look back at your leg ultrasound from last year in more detail after our visit. I had actually sent you a message after I got your ultrasound back as follows: I got your vascular studies from 7/17-  Your ankle brachial indices (comparinson of blood pressure between your leg and your arm)were normal  However they did see a possible occlusion of one artery in your left leg on ultrasound evaluation.  It is possible that this may be contributing to your leg pain and fatigue with exercise. I would suggest that we refer you to vascular surgery for their opinion. Would you like me to place a referral?  You can reply here! It looks like the message was read at that time. As above, you did have a possible occulusion in your left leg, and I wanted to offer you a vascular surgery referral. Is this something you would like to pursue at this time?   discussed above with pt.  He denies any pain in his legs with exercise or any other leg concerns.  He does not want to pursue this further at this time  Lab Results  Component Value Date   HGBA1C 6.5 12/20/2017    Eye exam: UTD Urine micro due Flu:  Done last week  He checks his blood sugar on occasion, but not often as has well controlled DM managed with diet only  He has not noted his glucose getting too low at all   uroxatral advil prn  He had a fall on Tuesday- today is Thursday. He fell out of the bed of a truck when he slipped on a wet spot.  He seems to have pulled his back  He went to the ER , had CT of his head and C spine.  All ok.   He scraped up his left elbow.  His mid left back is also sore He had a tetanus booster and was given a flexeril rx, but did not fill it as of yet   Patient Active Problem List   Diagnosis Date Noted  . Diabetes mellitus without complication ([place]) 07/37/1062  . Essential hypertension 06/20/2017    Past Medical History:  Diagnosis Date  . Arthritis   . Blood in urine    occ trace per pt   . Cataracts, bilateral   . Cervical spinal stenosis   . Diabetes mellitus    "borderline" doesn't require meds;diet and exercise;last  A1 C 6.2 a month ago  . Hiatal hernia    pt  states that he takes Pantoprazole daily for hiatal hernia but denies GERD  . Hx of colonic polyps     Past Surgical History:  Procedure Laterality Date  . ANTERIOR CERVICAL DECOMP/DISCECTOMY FUSION  02/16/2011   Procedure: ANTERIOR CERVICAL DECOMPRESSION/DISCECTOMY FUSION 3 LEVELS;  Surgeon: Eustace Moore;  Location: [facility];  Service: Neurosurgery;  Laterality: N/A;  Cervical Three-four,Cervical Four-Five,Cervical Five-Six,Anterior Cervical Decompression and Fusion with Nuvasive Plating  . arm surgery     at age 57;broke arm and had to go to the OR  . [place]  . COLONOSCOPY    . FINGER SURGERY  2012   right thumb   . SHOULDER SURGERY  04/12/2015    Social History   Tobacco Use  . Smoking status: Former Smoker    Packs/day: 0.25    Years: 15.00    Pack years: 3.75  . Smokeless tobacco: Never Used  . Tobacco comment: quit 36yrs ago  Substance Use Topics  .  Alcohol use: No  . Drug use: No    Family History  Problem Relation Age of Onset  . Anesthesia problems Neg Hx   . Hypotension Neg Hx   . Malignant hyperthermia Neg Hx   . Pseudochol deficiency Neg Hx   . Heart disease Neg Hx     Allergies  Allergen Reactions  . Ciprofloxacin Hcl   . Niacin And Related Other (See Comments)    Blood pressure drops and passes out  . Penicillins Hives  . Sulfa Antibiotics Other (See Comments)    Katherina Right Syndrome    Medication list has been reviewed and updated.  Current Outpatient Medications on File Prior to Visit  Medication Sig Dispense Refill  . alfuzosin (UROXATRAL) 10 MG 24 hr tablet Take 1 tablet by mouth daily.    Marland Kitchen glucose blood (FREESTYLE LITE) test strip Use as instructed 50 each 3  . ibuprofen (ADVIL,MOTRIN) 400 MG tablet Take 1 tablet (400 mg total) by mouth every 6 (six) hours as needed. (Patient taking differently: Take 800 mg by mouth every 6 (six) hours as needed. ) 30 tablet 0  . pantoprazole (PROTONIX) 40 MG tablet Take 1 tablet (40 mg total) by mouth daily. 90 tablet 3  . cyclobenzaprine (FLEXERIL) 5 MG tablet Take 1 tablet (5 mg total) by mouth 2 (two) times daily as needed for muscle spasms. (Patient not taking: Reported on 12/20/2017) 20 tablet 0   No current facility-administered medications on file prior to visit.     Review of Systems:  As per HPI- otherwise negative. No CP or SOB No fever or chills He notes good exercise tolerance   Physical Examination: Vitals:   12/20/17 0846  BP: 140/68  Pulse: (!) 57  Resp: 16  Temp: 97.7 F (36.5 C)  SpO2: 98%   Vitals:   12/20/17 0846  Weight: 209 lb (94.8 kg)  Height: 6\' 1"  (1.854 m)   Body mass index is 27.57 kg/m. Ideal Body Weight: Weight in (lb) to have BMI = 25: 189.1  GEN: WDWN, NAD, Non-toxic, A & O x 3, tall build, looks well  HEENT: Atraumatic, Normocephalic. Neck supple. No masses, No LAD. Ears and Nose: No external deformity. CV: RRR,  No M/G/R. No JVD. No thrill. No extra heart sounds. PULM: CTA B, no wheezes, crackles, rhonchi. No retractions. No resp. distress. No accessory muscle use. ABD: S, NT, ND, +BS. No rebound. No HSM. EXTR: No c/c/e NEURO Normal gait.  PSYCH: Normally interactive. Conversant.  Not depressed or anxious appearing.  Calm demeanor.  Abrasions on left arm which is bandaged He notes mild tenderness of the left mid back, right side.  No tenderness with palpation of ribs or compression of ribs to suggest a rib fracture  Normal foot exam today    Assessment and Plan: Diabetes mellitus without complication ([place]) - Plan: Basic metabolic panel, Hemoglobin A1c, Microalbumin / creatinine urine ratio  Essential hypertension  Dyslipidemia  Strain of mid-back, initial encounter  Following up on DM today A1c pending Diet controlled BP is under ok control He will fill flexeril to use as needed for hs back pain Plan to visit in about 6 months   Signed Lamar Blinks, MD  Received his labs- letter to pt   Your labs are overall good A1c shows good control of your blood sugar.   Metabolic profile is ok Your urine does show a small amount of protein- this can be a sign of early kidney damage.  I would recommend that we start you on a low dose of a type of blood pressure medication which has been shown to help prevent kidney damage.  If this would be ok with you, please just let me know.  Otherwise please come and see me in 6 months.   Results for orders placed or performed in visit on 63/87/56  Basic metabolic panel  Result Value Ref Range   Sodium 139 135 - 145 mEq/L   Potassium 3.8 3.5 - 5.1 mEq/L   Chloride 105 96 - 112 mEq/L   CO2 26 19 - 32 mEq/L   Glucose, Bld 124 (H) 70 - 99 mg/dL   BUN 18 6 - 23 mg/dL   Creatinine, Ser 1.10 0.40 - 1.50 mg/dL   Calcium 9.1 8.4 - 10.5 mg/dL   GFR 69.43 >60.00 mL/min  Hemoglobin A1c  Result Value Ref Range   Hgb A1c MFr Bld 6.5 4.6 - 6.5 %  Microalbumin /  creatinine urine ratio  Result Value Ref Range   Microalb, Ur 2.6 (H) 0.0 - 1.9 mg/dL   Creatinine,U 142.8 mg/dL   Microalb Creat Ratio 1.8 0.0 - 30.0 mg/g

## 2017-12-17 DIAGNOSIS — Z23 Encounter for immunization: Secondary | ICD-10-CM | POA: Diagnosis not present

## 2017-12-18 ENCOUNTER — Encounter (HOSPITAL_BASED_OUTPATIENT_CLINIC_OR_DEPARTMENT_OTHER): Payer: Self-pay | Admitting: *Deleted

## 2017-12-18 ENCOUNTER — Emergency Department (HOSPITAL_BASED_OUTPATIENT_CLINIC_OR_DEPARTMENT_OTHER)
Admission: EM | Admit: 2017-12-18 | Discharge: 2017-12-18 | Disposition: A | Discharge status: Home / Self Care | Payer: Medicare Other | Attending: Emergency Medicine | Admitting: Emergency Medicine

## 2017-12-18 ENCOUNTER — Encounter: Payer: Self-pay | Admitting: Neurology

## 2017-12-18 ENCOUNTER — Other Ambulatory Visit: Payer: Self-pay

## 2017-12-18 ENCOUNTER — Emergency Department (HOSPITAL_BASED_OUTPATIENT_CLINIC_OR_DEPARTMENT_OTHER): Payer: Medicare Other

## 2017-12-18 DIAGNOSIS — Z87891 Personal history of nicotine dependence: Secondary | ICD-10-CM | POA: Insufficient documentation

## 2017-12-18 DIAGNOSIS — W19XXXA Unspecified fall, initial encounter: Secondary | ICD-10-CM

## 2017-12-18 DIAGNOSIS — S80212A Abrasion, left knee, initial encounter: Secondary | ICD-10-CM | POA: Diagnosis not present

## 2017-12-18 DIAGNOSIS — E119 Type 2 diabetes mellitus without complications: Secondary | ICD-10-CM | POA: Diagnosis not present

## 2017-12-18 DIAGNOSIS — M546 Pain in thoracic spine: Secondary | ICD-10-CM | POA: Insufficient documentation

## 2017-12-18 DIAGNOSIS — T148XXA Other injury of unspecified body region, initial encounter: Secondary | ICD-10-CM

## 2017-12-18 DIAGNOSIS — W1789XA Other fall from one level to another, initial encounter: Secondary | ICD-10-CM | POA: Insufficient documentation

## 2017-12-18 DIAGNOSIS — Y9289 Other specified places as the place of occurrence of the external cause: Secondary | ICD-10-CM | POA: Insufficient documentation

## 2017-12-18 DIAGNOSIS — S50312A Abrasion of left elbow, initial encounter: Secondary | ICD-10-CM | POA: Insufficient documentation

## 2017-12-18 DIAGNOSIS — I10 Essential (primary) hypertension: Secondary | ICD-10-CM | POA: Insufficient documentation

## 2017-12-18 DIAGNOSIS — Y9389 Activity, other specified: Secondary | ICD-10-CM | POA: Diagnosis not present

## 2017-12-18 DIAGNOSIS — S60512A Abrasion of left hand, initial encounter: Secondary | ICD-10-CM | POA: Insufficient documentation

## 2017-12-18 DIAGNOSIS — Z23 Encounter for immunization: Secondary | ICD-10-CM | POA: Insufficient documentation

## 2017-12-18 DIAGNOSIS — S199XXA Unspecified injury of neck, initial encounter: Secondary | ICD-10-CM | POA: Diagnosis not present

## 2017-12-18 DIAGNOSIS — S80211A Abrasion, right knee, initial encounter: Secondary | ICD-10-CM | POA: Diagnosis not present

## 2017-12-18 DIAGNOSIS — Y999 Unspecified external cause status: Secondary | ICD-10-CM | POA: Diagnosis not present

## 2017-12-18 DIAGNOSIS — M542 Cervicalgia: Secondary | ICD-10-CM | POA: Diagnosis not present

## 2017-12-18 DIAGNOSIS — S0990XA Unspecified injury of head, initial encounter: Secondary | ICD-10-CM | POA: Diagnosis not present

## 2017-12-18 MED ORDER — CYCLOBENZAPRINE HCL 5 MG PO TABS: 5.0000 mg | ORAL_TABLET | Freq: Two times a day (BID) | ORAL | 0 refills | Status: DC | PRN

## 2017-12-18 MED ORDER — TETANUS-DIPHTH-ACELL PERTUSSIS 5-2.5-18.5 LF-MCG/0.5 IM SUSP
0.5000 mL | Freq: Once | INTRAMUSCULAR | Status: AC
  Administered 2017-12-18: 0.5 mL via INTRAMUSCULAR
  Filled 2017-12-18: qty 0.5

## 2017-12-18 MED ORDER — IBUPROFEN 400 MG PO TABS: 400.0000 mg | ORAL_TABLET | Freq: Four times a day (QID) | ORAL | 0 refills | Status: DC | PRN

## 2017-12-18 NOTE — ED Notes (Signed)
Waiting for ct to be read

## 2017-12-18 NOTE — Discharge Instructions (Signed)
Take Flexeril twice daily for muscle pain or spasms.  Do not drive or operate machinery while taking Flexeril.  Take ibuprofen every 6 hours as needed for pain.  Use ice and heat alternating 20 minutes on, 20 minutes off.  You can wash your wound through the bandage and apply antibiotic ointment prior to changing.  Please follow-up with your doctor if your symptoms are not improving.

## 2017-12-18 NOTE — ED Provider Notes (Signed)
[facility] EMERGENCY DEPARTMENT Provider Note   CSN: [id number] Arrival date & time: 12/18/17  1527     History   Chief Complaint Chief Complaint  Patient presents with  . Fall    HPI Randy Jensen is a 72 y.o. male with history of diabetes, cervical spinal stenosis who presents following fall.  Patient reports he slipped on a wet curb and hit the back of his head.  He did not lose consciousness.  He also scraped his left elbow, hand, and knees.  He has had some muscle pain on the left side of his back.  He has history of surgery to his cervical spine.  He denies any new neck pain, however continues to have stiffness.  His tetanus is not up-to-date.  He denies any chest pain, shortness of breath, abdominal pain, nausea, vomiting.  HPI  Past Medical History:  Diagnosis Date  . Arthritis   . Blood in urine    occ trace per pt   . Cataracts, bilateral   . Cervical spinal stenosis   . Diabetes mellitus    "borderline" doesn't require meds;diet and exercise;last  A1 C 6.2 a month ago  . Hiatal hernia    pt states that he takes Pantoprazole daily for hiatal hernia but denies GERD  . Hx of colonic polyps     Patient Active Problem List   Diagnosis Date Noted  . Diabetes mellitus without complication ([place]) 35/57/3220  . Essential hypertension 06/20/2017    Past Surgical History:  Procedure Laterality Date  . ANTERIOR CERVICAL DECOMP/DISCECTOMY FUSION  02/16/2011   Procedure: ANTERIOR CERVICAL DECOMPRESSION/DISCECTOMY FUSION 3 LEVELS;  Surgeon: Eustace Moore;  Location: [facility];  Service: Neurosurgery;  Laterality: N/A;  Cervical Three-four,Cervical Four-Five,Cervical Five-Six,Anterior Cervical Decompression and Fusion with Nuvasive Plating  . arm surgery     at age 28;broke arm and had to go to the OR  . [place]  . COLONOSCOPY    . FINGER SURGERY  2012   right thumb   . SHOULDER SURGERY  04/12/2015        Home Medications    Prior to  Admission medications   Medication Sig Start Date End Date Taking? Authorizing Provider  pantoprazole (PROTONIX) 40 MG tablet Take 1 tablet (40 mg total) by mouth daily. 08/25/16  Yes Copland, Gay Filler, MD  alfuzosin (UROXATRAL) 10 MG 24 hr tablet Take 1 tablet by mouth daily. 05/31/17 05/31/18  [provider]  cyclobenzaprine (FLEXERIL) 5 MG tablet Take 1 tablet (5 mg total) by mouth 2 (two) times daily as needed for muscle spasms. 12/18/17   Shalawn Wynder, Bea Graff, PA-C  glucose blood (FREESTYLE LITE) test strip Use as instructed 08/25/16   Copland, Gay Filler, MD  ibuprofen (ADVIL,MOTRIN) 400 MG tablet Take 1 tablet (400 mg total) by mouth every 6 (six) hours as needed. 12/18/17   Keyler Hoge, Bea Graff, PA-C  pantoprazole (PROTONIX) 40 MG tablet Take 1 tablet (40 mg total) daily by mouth. 01/08/17   Copland, Gay Filler, MD    Family History Family History  Problem Relation Age of Onset  . Anesthesia problems Neg Hx   . Hypotension Neg Hx   . Malignant hyperthermia Neg Hx   . Pseudochol deficiency Neg Hx   . Heart disease Neg Hx     Social History Social History   Tobacco Use  . Smoking status: Former Smoker    Packs/day: 0.25    Years: 15.00  Pack years: 3.75  . Tobacco comment: quit 3yrs ago  Substance Use Topics  . Alcohol use: No  . Drug use: No     Allergies   Ciprofloxacin hcl; Niacin and related; Penicillins; and Sulfa antibiotics   Review of Systems Review of Systems  Constitutional: Negative for chills and fever.  HENT: Negative for facial swelling and sore throat.   Respiratory: Negative for shortness of breath.   Cardiovascular: Negative for chest pain.  Gastrointestinal: Negative for abdominal pain, nausea and vomiting.  Genitourinary: Negative for dysuria.  Musculoskeletal: Positive for back pain and neck stiffness (baseline). Negative for neck pain.  Skin: Positive for wound. Negative for rash.  Neurological: Negative for syncope and headaches.    Psychiatric/Behavioral: The patient is not nervous/anxious.      Physical Exam Updated Vital Signs BP (!) 163/91 (BP Location: Right Arm)   Pulse (!) 56   Temp 98 F (36.7 C) (Oral)   Resp 18   Ht 6\' 1"  (1.854 m)   Wt 93 kg   SpO2 98%   BMI 27.05 kg/m   Physical Exam  Constitutional: He appears well-developed and well-nourished. No distress.  HENT:  Head: Normocephalic and atraumatic.  Mouth/Throat: Oropharynx is clear and moist. No oropharyngeal exudate.  Eyes: Pupils are equal, round, and reactive to light. Conjunctivae are normal. Right eye exhibits no discharge. Left eye exhibits no discharge. No scleral icterus.  Neck: Normal range of motion. Neck supple. No thyromegaly present.  Cardiovascular: Normal rate, regular rhythm, normal heart sounds and intact distal pulses. Exam reveals no gallop and no friction rub.  No murmur heard. Pulmonary/Chest: Effort normal and breath sounds normal. No stridor. No respiratory distress. He has no wheezes. He has no rales.  Abdominal: Soft. Bowel sounds are normal. He exhibits no distension. There is no tenderness. There is no rebound and no guarding.  Musculoskeletal: He exhibits no edema.  No midline cervical, thoracic, or lumbar tenderness Tightness and tenderness in the left thoracic paraspinal muscles  Lymphadenopathy:    He has no cervical adenopathy.  Neurological: He is alert. Coordination normal.  CN 3-12 intact; normal sensation throughout; 5/5 strength in all 4 extremities; equal bilateral grip strength  Skin: Skin is warm and dry. No rash noted. He is not diaphoretic. No pallor.  Psychiatric: He has a normal mood and affect.  Nursing note and vitals reviewed.    ED Treatments / Results  Labs (all labs ordered are listed, but only abnormal results are displayed) Labs Reviewed - No data to display  EKG None  Radiology Ct Head Wo Contrast  Result Date: 12/18/2017 CLINICAL DATA:  72 y/o  M; fall 5 feet with head  injury. Neck pain. EXAM: CT HEAD WITHOUT CONTRAST CT CERVICAL SPINE WITHOUT CONTRAST TECHNIQUE: Multidetector CT imaging of the head and cervical spine was performed following the standard protocol without intravenous contrast. Multiplanar CT image reconstructions of the cervical spine were also generated. COMPARISON:  06/26/2017 cervical spine radiographs. FINDINGS: CT HEAD FINDINGS Brain: No evidence of acute infarction, hemorrhage, hydrocephalus, extra-axial collection or mass lesion/mass effect. Mild chronic microvascular ischemic changes and volume loss of the brain. Small chronic lacunar infarct in the right hemi pons. Vascular: Mild calcific atherosclerosis of carotid siphons. No hyperdense vessel identified. Skull: Normal. Negative for fracture or focal lesion. Sinuses/Orbits: Mild paranasal sinus mucosal thickening. Normal aeration of the mastoid air cells. Bilateral intra-ocular lens replacement. Other: None. CT CERVICAL SPINE FINDINGS Alignment: Straightening of cervical lordosis. C7-T1 grade 1 anterolisthesis. Skull  base and vertebrae: C3-C6 anterior cervical discectomy and fusion. Hardware is intact and there is no periprosthetic lucency or fracture. No acute fracture identified. Soft tissues and spinal canal: No prevertebral fluid or swelling. No visible canal hematoma. Disc levels: Moderate disc and facet degenerative changes with loss of intervertebral disc space height greatest at the C2-3 and C6-7 levels. Uncovertebral and facet hypertrophy results in bony foraminal stenosis at the C2-3, bilateral C4-5,, and bilateral C6-7 levels. No high-grade bony canal stenosis. Upper chest: Negative. Other: Negative. IMPRESSION: CT head: 1. No acute intracranial abnormality identified. 2. Mild chronic microvascular ischemic changes and volume loss of the brain. CT cervical spine: 1. No acute fracture or dislocation identified. 2. C3-C6 anterior cervical discectomy and fusion without apparent hardware  complication. 3. Moderate multilevel degenerative changes of the cervical spine. Electronically Signed   By: Kristine Garbe M.D.   On: 12/18/2017 19:17   Ct Cervical Spine Wo Contrast  Result Date: 12/18/2017 CLINICAL DATA:  72 y/o  M; fall 5 feet with head injury. Neck pain. EXAM: CT HEAD WITHOUT CONTRAST CT CERVICAL SPINE WITHOUT CONTRAST TECHNIQUE: Multidetector CT imaging of the head and cervical spine was performed following the standard protocol without intravenous contrast. Multiplanar CT image reconstructions of the cervical spine were also generated. COMPARISON:  06/26/2017 cervical spine radiographs. FINDINGS: CT HEAD FINDINGS Brain: No evidence of acute infarction, hemorrhage, hydrocephalus, extra-axial collection or mass lesion/mass effect. Mild chronic microvascular ischemic changes and volume loss of the brain. Small chronic lacunar infarct in the right hemi pons. Vascular: Mild calcific atherosclerosis of carotid siphons. No hyperdense vessel identified. Skull: Normal. Negative for fracture or focal lesion. Sinuses/Orbits: Mild paranasal sinus mucosal thickening. Normal aeration of the mastoid air cells. Bilateral intra-ocular lens replacement. Other: None. CT CERVICAL SPINE FINDINGS Alignment: Straightening of cervical lordosis. C7-T1 grade 1 anterolisthesis. Skull base and vertebrae: C3-C6 anterior cervical discectomy and fusion. Hardware is intact and there is no periprosthetic lucency or fracture. No acute fracture identified. Soft tissues and spinal canal: No prevertebral fluid or swelling. No visible canal hematoma. Disc levels: Moderate disc and facet degenerative changes with loss of intervertebral disc space height greatest at the C2-3 and C6-7 levels. Uncovertebral and facet hypertrophy results in bony foraminal stenosis at the C2-3, bilateral C4-5,, and bilateral C6-7 levels. No high-grade bony canal stenosis. Upper chest: Negative. Other: Negative. IMPRESSION: CT head: 1. No  acute intracranial abnormality identified. 2. Mild chronic microvascular ischemic changes and volume loss of the brain. CT cervical spine: 1. No acute fracture or dislocation identified. 2. C3-C6 anterior cervical discectomy and fusion without apparent hardware complication. 3. Moderate multilevel degenerative changes of the cervical spine. Electronically Signed   By: Kristine Garbe M.D.   On: 12/18/2017 19:17    Procedures Procedures (including critical care time)  Medications Ordered in ED Medications  Tdap (BOOSTRIX) injection 0.5 mL (0.5 mLs Intramuscular Given 12/18/17 1726)     Initial Impression / Assessment and Plan / ED Course  I have reviewed the triage vital signs and the nursing notes.  Pertinent labs & imaging results that were available during my care of the patient were reviewed by me and considered in my medical decision making (see chart for details).     Patient presenting after fall.  He hit his head.  He has history of cervical stenosis and previous cervical surgery.  CT head and C-spine are negative for acute findings.  There were chronic changes and a remote lacunar infarct which patient was made  aware.  His wound was dressed with Mepitel and debrided by myself.  Follow-up to PCP as needed.  Flexeril and ibuprofen as well as ice and heat recommended.  Return precautions discussed.  Patient also evaluated by Dr. Rogene Houston who guided the patient's management and agrees with plan.  Final Clinical Impressions(s) / ED Diagnoses   Final diagnoses:  Fall, initial encounter  Abrasion  Acute left-sided thoracic back pain    ED Discharge Orders         Ordered    ibuprofen (ADVIL,MOTRIN) 400 MG tablet  Every 6 hours PRN     12/18/17 1858    cyclobenzaprine (FLEXERIL) 5 MG tablet  2 times daily PRN     12/18/17 1858           Frederica Kuster, PA-C 12/18/17 1929    Fredia Sorrow, MD 12/19/17 0009

## 2017-12-18 NOTE — ED Triage Notes (Signed)
He was stepping from a cement platform to the back of his truck. The metal bed of the truck was wet, he slipped and fell 5' hitting the back of his head. No LOC. Neck pain. Left elbow pain. He has abrasions to his left hand, elbow, and both knees. He has a hx of neck surgery with metal plates. He is ambulatory without distress. c collar at triage.

## 2017-12-18 NOTE — ED Notes (Signed)
Slipped on truckbed  [place] hit back of head  No loc, left elbow abrasion, left lower back pain

## 2017-12-19 ENCOUNTER — Ambulatory Visit (INDEPENDENT_AMBULATORY_CARE_PROVIDER_SITE_OTHER): Payer: Medicare Other | Admitting: Neurology

## 2017-12-19 DIAGNOSIS — G629 Polyneuropathy, unspecified: Secondary | ICD-10-CM

## 2017-12-19 DIAGNOSIS — R200 Anesthesia of skin: Secondary | ICD-10-CM | POA: Diagnosis not present

## 2017-12-19 DIAGNOSIS — M5412 Radiculopathy, cervical region: Secondary | ICD-10-CM

## 2017-12-19 NOTE — Procedures (Signed)
[facility]  [place], [place]  [place], [place] [address] Tel: [phone] Fax:  [phone] Test Date:  12/19/2017  Patient: Randy Jensen DOB: [birth year] Physician: Narda Amber, DO  Sex: Male Height: 6\' 1"  Ref Phys: Ophelia Charter, MD  ID#: [id number]   Technician:    Patient Complaints: This is a 72 year old man with history of C3-6 ACDF referred for evaluation of right proximal arm weakness and deltoid atrophy.    NCV & EMG Findings: Extensive electrodiagnostic testing of the right upper extremity and additional studies of the left shows:  1. Bilateral median, ulnar, and radial sensory responses show reduced amplitudes. 2. Bilateral median and ulnar motor responses are within normal limits.  Deltoid motor response on the right is severely reduced, and normal on the left. 3. Severe chronic motor axonal loss changes are seen affecting the C5-C6 myotomes bilaterally, which is worse on the right.  There is no evidence of accompanied active denervation.  Impression: 1. Chronic C5-6 radiculopathy affecting bilateral upper extremities, which is severe in degree electrically especially at the C5 nerve root/segment, and significantly worse on the right.   2. 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 3. In particular, there is no evidence of axillary mononeuropathy or right brachial plexopathy.   ___________________________ Narda Amber, DO    Nerve Conduction Studies Anti Sensory Summary Table   Site NR Peak (ms) Norm Peak (ms) P-T Amp (V) Norm P-T Amp  Left Median Anti Sensory (2nd Digit)  36C  Wrist    3.7 <3.8 5.0 >10  Right Median Anti Sensory (2nd Digit)  Wrist    3.7 <3.8 6.6 >10  Left Radial Anti Sensory (Base 1st Digit)  36C  Wrist    2.8 <2.8 5.1 >10  Right Radial Anti Sensory (Base 1st Digit)  36C  Wrist    2.7 <2.8 6.5 >10  Left Ulnar Anti Sensory (5th Digit)  36C   Wrist    2.7 <3.2 4.4 >5  Right Ulnar Anti Sensory (5th Digit)  36C  Wrist    2.7 <3.2 3.9 >5   Motor Summary Table   Site NR Onset (ms) Norm Onset (ms) O-P Amp (mV) Norm O-P Amp Site1 Site2 Delta-0 (ms) Dist (cm) Vel (m/s) Norm Vel (m/s)  Left Axillary Motor (Deltoid)  36C  Clavicle    1.6 <5.0 3.3 >3        Right Axillary Motor (Deltoid)  36C  Clavicle    2.7 <5.0 0.6 >3        Left Median Motor (Abd Poll Brev)  36C  Wrist    3.8 <4.0 7.1 >5 Elbow Wrist 6.0 31.0 52 >50  Elbow    9.8  6.6         Right Median Motor (Abd Poll Brev)  36C  Wrist    3.2 <4.0 7.1 >5 Elbow Wrist 5.9 33.0 56 >50  Elbow    9.1  7.1         Left Ulnar Motor (Abd Dig Minimi)  36C    Soft  tissue injury   Wrist    2.5 <3.1 7.7 >7 B Elbow Wrist 4.6 27.0 59 >50  B Elbow    7.1  5.5  A Elbow B Elbow 2.7 14.0 52 >50  A Elbow    9.8  5.9         Right Ulnar Motor (Abd Dig Minimi)  36C  Wrist  2.2 <3.1 10.2 >7 B Elbow Wrist 4.4 27.0 61 >50  B Elbow    6.6  9.4  A Elbow B Elbow 2.0 10.0 50 >50  A Elbow    8.6  8.2          EMG   Side Muscle Ins Act Fibs Psw Fasc Number Recrt Dur Dur. Amp Amp. Poly Poly. Comment  Right 1stDorInt Nml Nml Nml Nml Nml Nml Nml Nml Nml Nml Nml Nml N/A  Right Abd Poll Brev Nml Nml Nml Nml Nml Nml Nml Nml Nml Nml Nml Nml N/A  Right Ext Indicis Nml Nml Nml Nml Nml Nml Nml Nml Nml Nml Nml Nml N/A  Right PronatorTeres Nml Nml Nml Nml 2- Rapid Many 1+ Many 1+ Nml Nml N/A  Right Biceps Nml Nml Nml Nml 3- Rapid Most 1+ Most 1+ Few 1+ N/A  Right Triceps Nml Nml Nml Nml Nml Nml Nml Nml Nml Nml Nml Nml N/A  Right Deltoid Nml Nml Nml Nml [facility] Rapid All 1+ All 2+ All 1+ N/A  Right Teres Minor Nml Nml Nml Nml 3- Rapid Most 1+ Most 1+ Nml Nml N/A  Right Infraspinatus Nml Nml Nml Nml 3- Rapid Many 1+ Many 1+ Nml Nml N/A  Left PronatorTeres Nml Nml Nml Nml 2- Rapid Many 1+ Many 1+ Nml Nml N/A  Left Biceps Nml Nml Nml Nml 2- Rapid Many 1+ Many 1+ Nml Nml N/A  Left Triceps Nml Nml Nml Nml  Nml Nml Nml Nml Nml Nml Nml Nml N/A  Left Deltoid Nml Nml Nml Nml 3- Rapid Most 1+ Most 2+ Nml Nml N/A      Waveforms:

## 2017-12-20 ENCOUNTER — Encounter: Payer: Self-pay | Admitting: Family Medicine

## 2017-12-20 ENCOUNTER — Ambulatory Visit (INDEPENDENT_AMBULATORY_CARE_PROVIDER_SITE_OTHER): Payer: Medicare Other | Admitting: Family Medicine

## 2017-12-20 VITALS — BP 140/68 | HR 57 | Temp 97.7°F | Resp 16 | Ht 73.0 in | Wt 209.0 lb

## 2017-12-20 DIAGNOSIS — I10 Essential (primary) hypertension: Secondary | ICD-10-CM | POA: Diagnosis not present

## 2017-12-20 DIAGNOSIS — E119 Type 2 diabetes mellitus without complications: Secondary | ICD-10-CM

## 2017-12-20 DIAGNOSIS — S29012A Strain of muscle and tendon of back wall of thorax, initial encounter: Secondary | ICD-10-CM | POA: Diagnosis not present

## 2017-12-20 DIAGNOSIS — E785 Hyperlipidemia, unspecified: Secondary | ICD-10-CM

## 2017-12-20 LAB — MICROALBUMIN / CREATININE URINE RATIO
Creatinine,U: 142.8 mg/dL
Microalb Creat Ratio: 1.8 mg/g (ref 0.0–30.0)
Microalb, Ur: 2.6 mg/dL — ABNORMAL HIGH (ref 0.0–1.9)

## 2017-12-20 LAB — BASIC METABOLIC PANEL
BUN: 18 mg/dL (ref 6–23)
CO2: 26 mEq/L (ref 19–32)
Calcium: 9.1 mg/dL (ref 8.4–10.5)
Chloride: 105 mEq/L (ref 96–112)
Creatinine, Ser: 1.1 mg/dL (ref 0.40–1.50)
GFR: 69.43 mL/min (ref >60.00)
Glucose, Bld: 124 mg/dL — ABNORMAL HIGH (ref 70–99)
Potassium: 3.8 mEq/L (ref 3.5–5.1)
Sodium: 139 mEq/L (ref 135–145)

## 2017-12-20 LAB — HEMOGLOBIN A1C: Hgb A1c MFr Bld: 6.5 % (ref 4.6–6.5)

## 2017-12-20 NOTE — Patient Instructions (Addendum)
It was good to see you today- certainly try the flexeril for your back, and if you wish to visit a chiropractor that is also fine Let me know if your back is is not improving over the next couple of days  I will be in touch with your labs asap Please see me in about 6 months to check on your blood sugar

## 2017-12-26 ENCOUNTER — Other Ambulatory Visit: Payer: Self-pay | Admitting: Family Medicine

## 2017-12-26 MED ORDER — PANTOPRAZOLE SODIUM 40 MG PO TBEC: 40.0000 mg | DELAYED_RELEASE_TABLET | Freq: Every day | ORAL | 3 refills | Status: DC

## 2017-12-26 MED ORDER — IBUPROFEN 400 MG PO TABS: 400.0000 mg | ORAL_TABLET | Freq: Four times a day (QID) | ORAL | 0 refills | Status: DC | PRN

## 2017-12-26 NOTE — Telephone Encounter (Signed)
Requested medication (s) are due for refill today -yes  Requested medication (s) are on the active medication list -yes  Future visit scheduled -no  Last refill: pantoprazole 40 mg last written 08/24/16 3 RF      Ibuprofen 400 mg 12/18/17  Notes to clinic: pantoprazole meets protocol- but not addressed at appointment- for provider review Ibuprofen written by outside provider- sent for review  Requested Prescriptions  Pending Prescriptions Disp Refills   pantoprazole (PROTONIX) 40 MG tablet 90 tablet 3    Sig: Take 1 tablet (40 mg total) by mouth daily.     Gastroenterology: Proton Pump Inhibitors Passed - 12/26/2017  3:28 PM      Passed - Valid encounter within last 12 months    Recent Outpatient Visits          6 days ago Diabetes mellitus without complication ([place])   Archivist at [place], MD   6 months ago Benign prostatic hyperplasia, unspecified whether lower urinary tract symptoms present   Archivist at [facility], [place] C, MD   1 year ago Essential hypertension   Archivist at [place], Gay Filler, MD            ibuprofen (ADVIL,MOTRIN) 400 MG tablet 30 tablet 0    Sig: Take 1 tablet (400 mg total) by mouth every 6 (six) hours as needed.     Analgesics:  NSAIDS Passed - 12/26/2017  3:28 PM      Passed - Cr in normal range and within 360 days    Creat  Date Value Ref Range Status  05/11/2015 0.97 0.70 - 1.18 mg/dL Final   Creatinine, Ser  Date Value Ref Range Status  12/20/2017 1.10 0.40 - 1.50 mg/dL Final         Passed - HGB in normal range and within 360 days    Hemoglobin  Date Value Ref Range Status  06/18/2017 16.5 13.0 - 17.0 g/dL Final         Passed - Patient is not pregnant      Passed - Valid encounter within last 12 months    Recent Outpatient Visits          6 days ago Diabetes mellitus without complication ([place])    Archivist at [place], MD   6 months ago Benign prostatic hyperplasia, unspecified whether lower urinary tract symptoms present   Archivist at [facility], Gay Filler, MD   1 year ago Essential hypertension   Archivist at [place], Gay Filler, MD              Requested Prescriptions  Pending Prescriptions Disp Refills   pantoprazole (PROTONIX) 40 MG tablet 90 tablet 3    Sig: Take 1 tablet (40 mg total) by mouth daily.     Gastroenterology: Proton Pump Inhibitors Passed - 12/26/2017  3:28 PM      Passed - Valid encounter within last 12 months    Recent Outpatient Visits          6 days ago Diabetes mellitus without complication ([place])   Archivist at [place], MD   6 months ago Benign prostatic hyperplasia, unspecified whether lower urinary tract symptoms present   Archivist at [place],  MD   1 year ago Essential hypertension   Archivist at [place], MD            ibuprofen (ADVIL,MOTRIN) 400 MG tablet 30 tablet 0    Sig: Take 1 tablet (400 mg total) by mouth every 6 (six) hours as needed.     Analgesics:  NSAIDS Passed - 12/26/2017  3:28 PM      Passed - Cr in normal range and within 360 days    Creat  Date Value Ref Range Status  05/11/2015 0.97 0.70 - 1.18 mg/dL Final   Creatinine, Ser  Date Value Ref Range Status  12/20/2017 1.10 0.40 - 1.50 mg/dL Final         Passed - HGB in normal range and within 360 days    Hemoglobin  Date Value Ref Range Status  06/18/2017 16.5 13.0 - 17.0 g/dL Final         Passed - Patient is not pregnant      Passed - Valid encounter within last 12 months    Recent Outpatient Visits          6 days ago Diabetes mellitus without complication ([place])    Archivist at [place], MD   6 months ago Benign prostatic hyperplasia, unspecified whether lower urinary tract symptoms present   Archivist at [facility], Gay Filler, MD   1 year ago Essential hypertension   Archivist at [place], Gay Filler, MD

## 2017-12-26 NOTE — Telephone Encounter (Signed)
Copied from [place] [id number]. Topic: Quick Communication - See Telephone Encounter >> Dec 26, 2017  3:19 PM Ivar Drape wrote: CRM for notification. See Telephone encounter for: 12/26/17. Patient stated he was in to see the provider a week ago and two prescriptions were suppose to be sent over to his preferred pharmacy [facility] on [address] in [place]:  1) pantoprazole (PROTONIX) 40 MG tablet 2) Motrin  He would like these prescriptions to be sent ASAP, with a 69mth supply on them.

## 2018-01-07 ENCOUNTER — Telehealth: Payer: Self-pay | Admitting: Family Medicine

## 2018-01-07 MED ORDER — IBUPROFEN 800 MG PO TABS: 400.0000 mg | ORAL_TABLET | Freq: Three times a day (TID) | ORAL | 0 refills | Status: DC | PRN

## 2018-01-07 MED ORDER — PANTOPRAZOLE SODIUM 40 MG PO TBEC: 40.0000 mg | DELAYED_RELEASE_TABLET | Freq: Every day | ORAL | 0 refills | Status: DC

## 2018-01-07 NOTE — Telephone Encounter (Signed)
Pt insists that he is able to but a years supply of protonix and ibuprofen and wants me to write this rx for a year.  I am not sure if he can really do this but will write rx as asked

## 2018-01-07 NOTE — Telephone Encounter (Signed)
Copied from [place] [phone]. Topic: General - Other >> Jan 07, 2018 10:18 AM Alfredia Ferguson R wrote: Patient is calling in wanting a 6 mo supply of ibuprofen (ADVIL,MOTRIN) 400 MG tablet and pantoprazole (PROTONIX) 40 MG tablet he states he wants paper script and he will pick it up. Please advise pt.  Cb# [phone]

## 2018-01-08 DIAGNOSIS — R29898 Other symptoms and signs involving the musculoskeletal system: Secondary | ICD-10-CM | POA: Diagnosis not present

## 2018-01-08 NOTE — Telephone Encounter (Signed)
Rx mailed to patient.

## 2018-01-17 ENCOUNTER — Encounter

## 2018-01-17 ENCOUNTER — Encounter: Payer: Medicare Other | Admitting: Neurology

## 2018-02-15 DIAGNOSIS — R3912 Poor urinary stream: Secondary | ICD-10-CM | POA: Diagnosis not present

## 2018-05-14 DIAGNOSIS — L820 Inflamed seborrheic keratosis: Secondary | ICD-10-CM | POA: Diagnosis not present

## 2018-05-14 DIAGNOSIS — L819 Disorder of pigmentation, unspecified: Secondary | ICD-10-CM | POA: Diagnosis not present

## 2018-05-14 DIAGNOSIS — L570 Actinic keratosis: Secondary | ICD-10-CM | POA: Diagnosis not present

## 2018-06-11 DIAGNOSIS — L814 Other melanin hyperpigmentation: Secondary | ICD-10-CM | POA: Diagnosis not present

## 2018-06-11 DIAGNOSIS — L723 Sebaceous cyst: Secondary | ICD-10-CM | POA: Diagnosis not present

## 2018-06-11 DIAGNOSIS — B079 Viral wart, unspecified: Secondary | ICD-10-CM | POA: Diagnosis not present

## 2018-06-14 DIAGNOSIS — M25561 Pain in right knee: Secondary | ICD-10-CM | POA: Diagnosis not present

## 2018-06-20 ENCOUNTER — Other Ambulatory Visit: Payer: Self-pay

## 2018-06-20 ENCOUNTER — Encounter: Payer: Self-pay | Admitting: Family Medicine

## 2018-06-20 ENCOUNTER — Ambulatory Visit (HOSPITAL_BASED_OUTPATIENT_CLINIC_OR_DEPARTMENT_OTHER)
Admission: RE | Admit: 2018-06-20 | Discharge: 2018-06-20 | Disposition: A | Discharge status: Home / Self Care | Payer: Medicare Other | Source: Ambulatory Visit | Attending: Family Medicine | Admitting: Family Medicine

## 2018-06-20 ENCOUNTER — Ambulatory Visit (INDEPENDENT_AMBULATORY_CARE_PROVIDER_SITE_OTHER): Payer: Medicare Other | Admitting: Family Medicine

## 2018-06-20 ENCOUNTER — Ambulatory Visit: Payer: Self-pay

## 2018-06-20 DIAGNOSIS — M79604 Pain in right leg: Secondary | ICD-10-CM | POA: Insufficient documentation

## 2018-06-20 DIAGNOSIS — M79661 Pain in right lower leg: Secondary | ICD-10-CM | POA: Diagnosis not present

## 2018-06-20 NOTE — Progress Notes (Signed)
[place] at [facility] [address], [place], [place] [address] [phone]  Date:  06/20/2018   Name:  Randy Jensen   DOB:  Mar 25, 1945   MRN:  [id number]  PCP:  Darreld Mclean, MD    Chief Complaint: No chief complaint on file.   History of Present Illness:  Randy Jensen is a 73 y.o. very pleasant male patient who presents with the following:  Virtual visit today due to COVID 19 outbreak Pt location is home Provider location is office PT ID confirmed with name and DOB, he gives consent for virtual visit today   Last seen by myself in October for a diabetes check up- diet control with excellent last A1c   Lab Results  Component Value Date   HGBA1C 6.5 12/20/2017   Today he notes concern of right calf soreness.  This has been going on for the last couple of days  Sx on the RIGHT side only He does not notice any swelling or redness No acute injury that he can recall  He did have a LEFT calf DVT back in 2017 though due to immobility from surgery- he took a blood thinner for a few months but was able to stop Otherwise he has never had a clot His brother has blood clots as well His foot feels ok, not numb  He is worried about a DVT and would like to have a repeat US today   No SOB or CP  We did ABI testing in 2018 due to possible claudication sx.  All was ok except for possible left peroneal artery disease.  I had offered to refer pt to vascular surgery for a consultation at that time but he did not respond to me.    Patient Active Problem List   Diagnosis Date Noted  . Diabetes mellitus without complication ([place]) 30/13/1438  . Essential hypertension 06/20/2017    Past Medical History:  Diagnosis Date  . Arthritis   . Blood in urine    occ trace per pt   . Cataracts, bilateral   . Cervical spinal stenosis   . Diabetes mellitus    "borderline" doesn't require meds;diet and exercise;last  A1 C 6.2 a month ago  .  Hiatal hernia    pt states that he takes Pantoprazole daily for hiatal hernia but denies GERD  . Hx of colonic polyps     Past Surgical History:  Procedure Laterality Date  . ANTERIOR CERVICAL DECOMP/DISCECTOMY FUSION  02/16/2011   Procedure: ANTERIOR CERVICAL DECOMPRESSION/DISCECTOMY FUSION 3 LEVELS;  Surgeon: Eustace Moore;  Location: [facility];  Service: Neurosurgery;  Laterality: N/A;  Cervical Three-four,Cervical Four-Five,Cervical Five-Six,Anterior Cervical Decompression and Fusion with Nuvasive Plating  . arm surgery     at age 66;broke arm and had to go to the OR  . [place]  . COLONOSCOPY    . FINGER SURGERY  2012   right thumb   . SHOULDER SURGERY  04/12/2015    Social History   Tobacco Use  . Smoking status: Former Smoker    Packs/day: 0.25    Years: 15.00    Pack years: 3.75  . Smokeless tobacco: Never Used  . Tobacco comment: quit 67yrs ago  Substance Use Topics  . Alcohol use: No  . Drug use: No    Family History  Problem Relation Age of Onset  . Anesthesia problems Neg Hx   . Hypotension  Neg Hx   . Malignant hyperthermia Neg Hx   . Pseudochol deficiency Neg Hx   . Heart disease Neg Hx     Allergies  Allergen Reactions  . Ciprofloxacin Hcl   . Niacin And Related Other (See Comments)    Blood pressure drops and passes out  . Penicillins Hives  . Sulfa Antibiotics Other (See Comments)    Katherina Right Syndrome    Medication list has been reviewed and updated.  Current Outpatient Medications on File Prior to Visit  Medication Sig Dispense Refill  . cyclobenzaprine (FLEXERIL) 5 MG tablet Take 1 tablet (5 mg total) by mouth 2 (two) times daily as needed for muscle spasms. (Patient not taking: Reported on 12/20/2017) 20 tablet 0  . glucose blood (FREESTYLE LITE) test strip Use as instructed 50 each 3  . ibuprofen (ADVIL,MOTRIN) 800 MG tablet Take 0.5 tablets (400 mg total) by mouth every 8 (eight) hours as needed. 180 tablet 0  .  pantoprazole (PROTONIX) 40 MG tablet Take 1 tablet (40 mg total) by mouth daily. 360 tablet 0   No current facility-administered medications on file prior to visit.     Review of Systems:  As per HPI- otherwise negative.   Physical Examination: There were no vitals filed for this visit. There were no vitals filed for this visit. There is no height or weight on file to calculate BMI. Ideal Body Weight:    Pt observed over video- he looks well, no wheezing cough or distress is noted   Assessment and Plan: Right leg pain - Plan: US Venous Img Lower Unilateral Right  Pain in right calf, concern for DVT US ordered  He is coming in for [place] now  Signed Lamar Blinks, MD  Received his [place] as follows:  US Venous Img Lower Unilateral Right  Result Date: 06/20/2018 CLINICAL DATA:  73 year old male with a history of right lower extremity pain EXAM: RIGHT LOWER EXTREMITY VENOUS DOPPLER ULTRASOUND TECHNIQUE: Gray-scale sonography with graded compression, as well as color Doppler and duplex ultrasound were performed to evaluate the lower extremity deep venous systems from the level of the common femoral vein and including the common femoral, femoral, profunda femoral, popliteal and calf veins including the posterior tibial, peroneal and gastrocnemius veins when visible. The superficial great saphenous vein was also interrogated. Spectral Doppler was utilized to evaluate flow at rest and with distal augmentation maneuvers in the common femoral, femoral and popliteal veins. COMPARISON:  None. FINDINGS: Contralateral Common Femoral Vein: Respiratory phasicity is normal and symmetric with the symptomatic side. No evidence of thrombus. Normal compressibility. Common Femoral Vein: No evidence of thrombus. Normal compressibility, respiratory phasicity and response to augmentation. Saphenofemoral Junction: No evidence of thrombus. Normal compressibility and flow on color Doppler imaging. Profunda Femoral  Vein: No evidence of thrombus. Normal compressibility and flow on color Doppler imaging. Femoral Vein: No evidence of thrombus. Normal compressibility, respiratory phasicity and response to augmentation. Popliteal Vein: No evidence of thrombus. Normal compressibility, respiratory phasicity and response to augmentation. Calf Veins: No evidence of thrombus. Normal compressibility and flow on color Doppler imaging. Superficial Great Saphenous Vein: No evidence of thrombus. Normal compressibility and flow on color Doppler imaging. Other Findings:  None. IMPRESSION: Sonographic survey of the right lower extremity negative for DVT Electronically Signed   By: Corrie Mckusick D.O.   On: 06/20/2018 16:31   Called pt and LMOM- no sign of DVT.  Tried him again at 8:30 pm- still no answer mychart message to pt

## 2018-06-20 NOTE — Telephone Encounter (Signed)
Pt c/o sore at calf on right leg. No warmth or redness. No swelling. Pt stated that it feels like he has a sore muscle. Pt denies doing any activity that could be causing the soreness.  Pt was treated back in 2016 for a blood clot Care advice was given and pt verbalized understanding. Pt verified his email and advised pt the visit will be virtual.  Reason for Disposition . [1] Thigh or calf pain AND [2] only 1 side AND [3] present > 1 hour  Answer Assessment - Initial Assessment Questions 1. ONSET: "When did the pain start?"      2 yesterday noted it off and on  2. LOCATION: "Where is the pain located?"      Right calf 3. PAIN: "How bad is the pain?"    (Scale 1-10; or mild, moderate, severe)   -  MILD (1-3): doesn't interfere with normal activities    -  MODERATE (4-7): interferes with normal activities (e.g., work or school) or awakens from sleep, limping    -  SEVERE (8-10): excruciating pain, unable to do any normal activities, unable to walk     mild 4. WORK OR EXERCISE: "Has there been any recent work or exercise that involved this part of the body?"      no 5. CAUSE: "What do you think is causing the leg pain?"     Worried about blood clot- was in right leg was found in 2016 6. OTHER SYMPTOMS: "Do you have any other symptoms?" (e.g., chest pain, back pain, breathing difficulty, swelling, rash, fever, numbness, weakness)     No 7. PREGNANCY: "Is there any chance you are pregnant?" "When was your last menstrual period?"     n/a  Protocols used: LEG PAIN-A-AH

## 2018-06-21 ENCOUNTER — Telehealth: Payer: Self-pay | Admitting: *Deleted

## 2018-06-21 NOTE — Telephone Encounter (Signed)
Attempted to reach pt and left detailed message that PCP has sent him detailed message of results in his mychart account and to log in and take a look at those and let us know if has any further questions.  Copied from [place] [phone]. Topic: Quick Communication - See Telephone Encounter >> Jun 20, 2018  6:06 PM Loma [place] wrote: CRM for notification. See Telephone encounter for: 06/20/18.PT just called and is returning call from Dr Lorelei Pont a few minutes ago about his test results. He is sorry he missed the call and has his phone with him now. [phone]

## 2018-06-27 ENCOUNTER — Telehealth: Payer: Self-pay | Admitting: Family Medicine

## 2018-06-27 DIAGNOSIS — Z130 Encounter for screening for diseases of the blood and blood-forming organs and certain disorders involving the immune mechanism: Secondary | ICD-10-CM

## 2018-06-27 DIAGNOSIS — E785 Hyperlipidemia, unspecified: Secondary | ICD-10-CM

## 2018-06-27 DIAGNOSIS — E119 Type 2 diabetes mellitus without complications: Secondary | ICD-10-CM

## 2018-06-27 DIAGNOSIS — I10 Essential (primary) hypertension: Secondary | ICD-10-CM

## 2018-06-27 DIAGNOSIS — N400 Enlarged prostate without lower urinary tract symptoms: Secondary | ICD-10-CM

## 2018-06-27 DIAGNOSIS — Z125 Encounter for screening for malignant neoplasm of prostate: Secondary | ICD-10-CM

## 2018-06-27 NOTE — Telephone Encounter (Signed)
Called patient to set up an appointment for yearly blood work and A1c. No orders in the system and patient just had an appointment. Please advise if another appointment is needed or if he could just come in for lab work sometime next week.

## 2018-06-28 ENCOUNTER — Encounter: Payer: Self-pay | Admitting: Family Medicine

## 2018-07-02 ENCOUNTER — Other Ambulatory Visit (INDEPENDENT_AMBULATORY_CARE_PROVIDER_SITE_OTHER): Payer: Medicare Other

## 2018-07-02 ENCOUNTER — Other Ambulatory Visit: Payer: Self-pay

## 2018-07-02 DIAGNOSIS — Z125 Encounter for screening for malignant neoplasm of prostate: Secondary | ICD-10-CM | POA: Diagnosis not present

## 2018-07-02 DIAGNOSIS — Z130 Encounter for screening for diseases of the blood and blood-forming organs and certain disorders involving the immune mechanism: Secondary | ICD-10-CM | POA: Diagnosis not present

## 2018-07-02 DIAGNOSIS — E785 Hyperlipidemia, unspecified: Secondary | ICD-10-CM | POA: Diagnosis not present

## 2018-07-02 DIAGNOSIS — N400 Enlarged prostate without lower urinary tract symptoms: Secondary | ICD-10-CM

## 2018-07-02 DIAGNOSIS — E119 Type 2 diabetes mellitus without complications: Secondary | ICD-10-CM

## 2018-07-02 DIAGNOSIS — I10 Essential (primary) hypertension: Secondary | ICD-10-CM | POA: Diagnosis not present

## 2018-07-02 LAB — LIPID PANEL
Cholesterol: 172 mg/dL (ref 0–200)
HDL: 34.4 mg/dL — ABNORMAL LOW (ref >39.00)
LDL Cholesterol: 115 mg/dL — ABNORMAL HIGH (ref 0–99)
NonHDL: 137.98
Total CHOL/HDL Ratio: 5
Triglycerides: 113 mg/dL (ref 0.0–149.0)
VLDL: 22.6 mg/dL (ref 0.0–40.0)

## 2018-07-02 LAB — COMPREHENSIVE METABOLIC PANEL
ALT: 15 U/L (ref 0–53)
AST: 18 U/L (ref 0–37)
Albumin: 4.3 g/dL (ref 3.5–5.2)
Alkaline Phosphatase: 76 U/L (ref 39–117)
BUN: 18 mg/dL (ref 6–23)
CO2: 29 mEq/L (ref 19–32)
Calcium: 9 mg/dL (ref 8.4–10.5)
Chloride: 104 mEq/L (ref 96–112)
Creatinine, Ser: 1.09 mg/dL (ref 0.40–1.50)
GFR: 65.92 mL/min (ref >60.00)
Glucose, Bld: 109 mg/dL — ABNORMAL HIGH (ref 70–99)
Potassium: 4.6 mEq/L (ref 3.5–5.1)
Sodium: 140 mEq/L (ref 135–145)
Total Bilirubin: 0.9 mg/dL (ref 0.2–1.2)
Total Protein: 7 g/dL (ref 6.0–8.3)

## 2018-07-02 LAB — CBC
HCT: 48.1 % (ref 39.0–52.0)
Hemoglobin: 16.8 g/dL (ref 13.0–17.0)
MCHC: 34.8 g/dL (ref 30.0–36.0)
MCV: 93.1 fl (ref 78.0–100.0)
Platelets: 176 10*3/uL (ref 150.0–400.0)
RBC: 5.17 Mil/uL (ref 4.22–5.81)
RDW: 13.9 % (ref 11.5–15.5)
WBC: 8.1 10*3/uL (ref 4.0–10.5)

## 2018-07-02 LAB — HEMOGLOBIN A1C: Hgb A1c MFr Bld: 6.9 % — ABNORMAL HIGH (ref 4.6–6.5)

## 2018-07-02 LAB — PSA: PSA: 1.11 ng/mL (ref 0.10–4.00)

## 2018-07-04 ENCOUNTER — Encounter: Payer: Self-pay | Admitting: Family Medicine

## 2018-08-08 DIAGNOSIS — H6123 Impacted cerumen, bilateral: Secondary | ICD-10-CM | POA: Diagnosis not present

## 2018-09-18 DIAGNOSIS — Z961 Presence of intraocular lens: Secondary | ICD-10-CM | POA: Diagnosis not present

## 2018-09-18 DIAGNOSIS — D3131 Benign neoplasm of right choroid: Secondary | ICD-10-CM | POA: Diagnosis not present

## 2018-09-18 DIAGNOSIS — H524 Presbyopia: Secondary | ICD-10-CM | POA: Diagnosis not present

## 2018-09-24 DIAGNOSIS — L723 Sebaceous cyst: Secondary | ICD-10-CM | POA: Diagnosis not present

## 2018-09-24 DIAGNOSIS — L821 Other seborrheic keratosis: Secondary | ICD-10-CM | POA: Diagnosis not present

## 2018-09-24 DIAGNOSIS — L570 Actinic keratosis: Secondary | ICD-10-CM | POA: Diagnosis not present

## 2018-09-24 DIAGNOSIS — L700 Acne vulgaris: Secondary | ICD-10-CM | POA: Diagnosis not present

## 2018-10-03 ENCOUNTER — Other Ambulatory Visit: Payer: Self-pay

## 2018-10-04 ENCOUNTER — Ambulatory Visit (INDEPENDENT_AMBULATORY_CARE_PROVIDER_SITE_OTHER): Payer: Medicare Other | Admitting: Family Medicine

## 2018-10-04 ENCOUNTER — Ambulatory Visit (HOSPITAL_BASED_OUTPATIENT_CLINIC_OR_DEPARTMENT_OTHER)
Admission: RE | Admit: 2018-10-04 | Discharge: 2018-10-04 | Disposition: A | Discharge status: Home / Self Care | Payer: Medicare Other | Source: Ambulatory Visit | Attending: Family Medicine | Admitting: Family Medicine

## 2018-10-04 ENCOUNTER — Encounter: Payer: Self-pay | Admitting: Family Medicine

## 2018-10-04 VITALS — BP 146/70 | HR 72 | Temp 98.0°F | Resp 16 | Ht 73.0 in | Wt 208.0 lb

## 2018-10-04 DIAGNOSIS — M79672 Pain in left foot: Secondary | ICD-10-CM

## 2018-10-04 DIAGNOSIS — M19072 Primary osteoarthritis, left ankle and foot: Secondary | ICD-10-CM | POA: Diagnosis not present

## 2018-10-04 DIAGNOSIS — R739 Hyperglycemia, unspecified: Secondary | ICD-10-CM | POA: Diagnosis not present

## 2018-10-04 DIAGNOSIS — R600 Localized edema: Secondary | ICD-10-CM | POA: Insufficient documentation

## 2018-10-04 LAB — COMPREHENSIVE METABOLIC PANEL
ALT: 11 U/L (ref 0–53)
AST: 13 U/L (ref 0–37)
Albumin: 4.3 g/dL (ref 3.5–5.2)
Alkaline Phosphatase: 69 U/L (ref 39–117)
BUN: 14 mg/dL (ref 6–23)
CO2: 29 mEq/L (ref 19–32)
Calcium: 9.9 mg/dL (ref 8.4–10.5)
Chloride: 103 mEq/L (ref 96–112)
Creatinine, Ser: 1.11 mg/dL (ref 0.40–1.50)
GFR: 64.51 mL/min (ref >60.00)
Glucose, Bld: 170 mg/dL — ABNORMAL HIGH (ref 70–99)
Potassium: 4.6 mEq/L (ref 3.5–5.1)
Sodium: 141 mEq/L (ref 135–145)
Total Bilirubin: 1 mg/dL (ref 0.2–1.2)
Total Protein: 7.1 g/dL (ref 6.0–8.3)

## 2018-10-04 LAB — URIC ACID: Uric Acid, Serum: 5 mg/dL (ref 4.0–7.8)

## 2018-10-04 LAB — HEMOGLOBIN A1C: Hgb A1c MFr Bld: 6.6 % — ABNORMAL HIGH (ref 4.6–6.5)

## 2018-10-04 MED ORDER — SPIRONOLACTONE 25 MG PO TABS: 25.0000 mg | ORAL_TABLET | Freq: Every day | ORAL | 2 refills | Status: DC

## 2018-10-04 MED ORDER — HYDROCHLOROTHIAZIDE 25 MG PO TABS: 25.0000 mg | ORAL_TABLET | Freq: Every day | ORAL | 3 refills | Status: DC

## 2018-10-04 NOTE — Assessment & Plan Note (Signed)
Elevated ext Add spironolactone--- pt unable to take sulfa drugs F/u pcp 2 weeks or sooner prn

## 2018-10-04 NOTE — Patient Instructions (Signed)

## 2018-10-04 NOTE — Progress Notes (Signed)
Patient ID: Randy Jensen, male    DOB: 1945-07-04  Age: 73 y.o. MRN: [id number]    Subjective:  Subjective  HPI Randy Jensen presents for L foot swelling and pain that started Tuesday.  No known injury.  No sob, no cp.  Pain is on the bottom of his foot.    Review of Systems  Constitutional: Negative for chills and fever.  HENT: Negative for congestion and hearing loss.   Eyes: Negative for discharge.  Respiratory: Negative for cough and shortness of breath.   Cardiovascular: Positive for leg swelling. Negative for chest pain and palpitations.  Gastrointestinal: Negative for abdominal pain, blood in stool, constipation, diarrhea, nausea and vomiting.  Genitourinary: Negative for dysuria, frequency, hematuria and urgency.  Musculoskeletal: Negative for back pain and myalgias.  Skin: Negative for rash.  Allergic/Immunologic: Negative for environmental allergies.  Neurological: Negative for dizziness, weakness and headaches.  Hematological: Does not bruise/bleed easily.  Psychiatric/Behavioral: Negative for suicidal ideas. The patient is not nervous/anxious.     History Past Medical History:  Diagnosis Date   Arthritis    Blood in urine    occ trace per pt    Cataracts, bilateral    Cervical spinal stenosis    Diabetes mellitus    "borderline" doesn't require meds;diet and exercise;last  A1 C 6.2 a month ago   Hiatal hernia    pt states that he takes Pantoprazole daily for hiatal hernia but denies GERD   Hx of colonic polyps     He has a past surgical history that includes Back surgery (1982); Finger surgery (2012); arm surgery; Colonoscopy; Anterior cervical decomp/discectomy fusion (02/16/2011); and Shoulder surgery (04/12/2015).   His family history is not on file.He reports that he has quit smoking. He has a 3.75 pack-year smoking history. He has never used smokeless tobacco. He reports that he does not drink alcohol or use drugs.  Current Outpatient Medications on  File Prior to Visit  Medication Sig Dispense Refill   cyclobenzaprine (FLEXERIL) 5 MG tablet Take 1 tablet (5 mg total) by mouth 2 (two) times daily as needed for muscle spasms. (Patient not taking: Reported on 12/20/2017) 20 tablet 0   glucose blood (FREESTYLE LITE) test strip Use as instructed 50 each 3   ibuprofen (ADVIL,MOTRIN) 800 MG tablet Take 0.5 tablets (400 mg total) by mouth every 8 (eight) hours as needed. 180 tablet 0   pantoprazole (PROTONIX) 40 MG tablet Take 1 tablet (40 mg total) by mouth daily. 360 tablet 0   No current facility-administered medications on file prior to visit.      Objective:  Objective  Physical Exam Vitals signs and nursing note reviewed.  Constitutional:      General: He is sleeping.     Appearance: He is well-developed.  HENT:     Head: Normocephalic and atraumatic.  Eyes:     Pupils: Pupils are equal, round, and reactive to light.  Neck:     Musculoskeletal: Normal range of motion and neck supple.     Thyroid: No thyromegaly.  Cardiovascular:     Rate and Rhythm: Normal rate and regular rhythm.     Heart sounds: No murmur.  Pulmonary:     Effort: Pulmonary effort is normal. No respiratory distress.     Breath sounds: Normal breath sounds. No wheezing or rales.  Chest:     Chest wall: No tenderness.  Musculoskeletal:        General: Swelling present. No tenderness.  Left lower leg: Edema present.       Feet:  Skin:    General: Skin is warm and dry.  Neurological:     Mental Status: He is oriented to person, place, and time.  Psychiatric:        Behavior: Behavior normal.        Thought Content: Thought content normal.        Judgment: Judgment normal.    BP (!) 146/70 (BP Location: Left Arm, Patient Position: Sitting, Cuff Size: Normal)    Pulse 72    Temp 98 F (36.7 C) (Oral)    Resp 16    Ht 6\' 1"  (1.854 m)    Wt 208 lb (94.3 kg)    SpO2 99%    BMI 27.44 kg/m  Wt Readings from Last 3 Encounters:  10/04/18 208 lb (94.3  kg)  12/20/17 209 lb (94.8 kg)  12/18/17 205 lb (93 kg)     Lab Results  Component Value Date   WBC 8.1 07/02/2018   HGB 16.8 07/02/2018   HCT 48.1 07/02/2018   PLT 176.0 07/02/2018   GLUCOSE 109 (H) 07/02/2018   CHOL 172 07/02/2018   TRIG 113.0 07/02/2018   HDL 34.40 (L) 07/02/2018   LDLCALC 115 (H) 07/02/2018   ALT 15 07/02/2018   AST 18 07/02/2018   NA 140 07/02/2018   K 4.6 07/02/2018   CL 104 07/02/2018   CREATININE 1.09 07/02/2018   BUN 18 07/02/2018   CO2 29 07/02/2018   PSA 1.11 07/02/2018   INR 0.99 02/10/2011   HGBA1C 6.9 (H) 07/02/2018   MICROALBUR 2.6 (H) 12/20/2017    US Venous Img Lower Unilateral Right  Result Date: 06/20/2018 CLINICAL DATA:  73 year old male with a history of right lower extremity pain EXAM: RIGHT LOWER EXTREMITY VENOUS DOPPLER ULTRASOUND TECHNIQUE: Gray-scale sonography with graded compression, as well as color Doppler and duplex ultrasound were performed to evaluate the lower extremity deep venous systems from the level of the common femoral vein and including the common femoral, femoral, profunda femoral, popliteal and calf veins including the posterior tibial, peroneal and gastrocnemius veins when visible. The superficial great saphenous vein was also interrogated. Spectral Doppler was utilized to evaluate flow at rest and with distal augmentation maneuvers in the common femoral, femoral and popliteal veins. COMPARISON:  None. FINDINGS: Contralateral Common Femoral Vein: Respiratory phasicity is normal and symmetric with the symptomatic side. No evidence of thrombus. Normal compressibility. Common Femoral Vein: No evidence of thrombus. Normal compressibility, respiratory phasicity and response to augmentation. Saphenofemoral Junction: No evidence of thrombus. Normal compressibility and flow on color Doppler imaging. Profunda Femoral Vein: No evidence of thrombus. Normal compressibility and flow on color Doppler imaging. Femoral Vein: No evidence  of thrombus. Normal compressibility, respiratory phasicity and response to augmentation. Popliteal Vein: No evidence of thrombus. Normal compressibility, respiratory phasicity and response to augmentation. Calf Veins: No evidence of thrombus. Normal compressibility and flow on color Doppler imaging. Superficial Great Saphenous Vein: No evidence of thrombus. Normal compressibility and flow on color Doppler imaging. Other Findings:  None. IMPRESSION: Sonographic survey of the right lower extremity negative for DVT Electronically Signed   By: Corrie Mckusick D.O.   On: 06/20/2018 16:31     Assessment & Plan:  Plan  I have discontinued Zenia Resides K. Serviss's hydrochlorothiazide. I am also having him start on spironolactone. Additionally, I am having him maintain his glucose blood, cyclobenzaprine, pantoprazole, and ibuprofen.  Meds ordered this encounter  Medications  DISCONTD: hydrochlorothiazide (HYDRODIURIL) 25 MG tablet    Sig: Take 1 tablet (25 mg total) by mouth daily.    Dispense:  90 tablet    Refill:  3   spironolactone (ALDACTONE) 25 MG tablet    Sig: Take 1 tablet (25 mg total) by mouth daily.    Dispense:  30 tablet    Refill:  2    Problem List Items Addressed This Visit      Unprioritized   Edema of left lower extremity    Elevated ext Add spironolactone--- pt unable to take sulfa drugs F/u pcp 2 weeks or sooner prn       Relevant Medications   spironolactone (ALDACTONE) 25 MG tablet    Other Visit Diagnoses    Left foot pain    -  Primary   Relevant Orders   DG Foot Complete Left   Comprehensive metabolic panel   Uric acid   Hyperglycemia       Relevant Orders   Hemoglobin A1c      Follow-up: Return in about 2 weeks (around 10/18/2018), or if symptoms worsen or fail to improve, for f/u pcp for edema and pain.  Ann Held, DO

## 2018-10-22 NOTE — Progress Notes (Signed)
[facility] [place], [place], [place] [address] [phone]  Date:  10/24/2018   Name:  Randy Jensen   DOB:  28-Feb-1946   MRN:  [id number]  PCP:  Darreld Mclean, MD    Chief Complaint: Edema (2 week follow up) and Flu Vaccine   History of Present Illness:  Randy Jensen is a 73 y.o. very pleasant male patient who presents with the following:  Patient with history of diet-controlled diabetes, hypertension, lower extremity edema, cervical spine stenosis past status post discectomy and fusion 2012  Seen earlier this month by Dr. Etter Sjogren with concern of left foot swelling and pain She had him stop HCTZ and start spironolactone- however he is actually just taking protonix Potassium level was normal  A1c okay at recent labs  Eye exam:  He went in July Foot exam due today Flu shot due- done today  Colonoscopy due this year- he would like to start with a cologuard  Suggest Shingrix to be done at drug store  Last seen by myself in April when he was having right leg pain-at that time I did an ultrasound which was negative for DVT  He would like to lose weight, but it is hard to exercise with his knee pain He is able to walk about 2 miles and then has knee pain He also has an exercise bike that he can use at home  He does play golf a few times a week No CP or SOB with exercise He has noted a "bulge" in his stomach when he tries to do leg lifts as an ab exercise- not painful at all   Lab Results  Component Value Date   HGBA1C 6.6 (H) 10/04/2018     Patient Active Problem List   Diagnosis Date Noted  . Edema of left lower extremity 10/04/2018  . Diabetes mellitus without complication ([place]) 123XX123  . Essential hypertension 06/20/2017    Past Medical History:  Diagnosis Date  . Arthritis   . Blood in urine    occ trace per pt   . Cataracts, bilateral   . Cervical spinal stenosis   . Diabetes mellitus     "borderline" doesn't require meds;diet and exercise;last  A1 C 6.2 a month ago  . Hiatal hernia    pt states that he takes Pantoprazole daily for hiatal hernia but denies GERD  . Hx of colonic polyps     Past Surgical History:  Procedure Laterality Date  . ANTERIOR CERVICAL DECOMP/DISCECTOMY FUSION  02/16/2011   Procedure: ANTERIOR CERVICAL DECOMPRESSION/DISCECTOMY FUSION 3 LEVELS;  Surgeon: Eustace Moore;  Location: [facility];  Service: Neurosurgery;  Laterality: N/A;  Cervical Three-four,Cervical Four-Five,Cervical Five-Six,Anterior Cervical Decompression and Fusion with Nuvasive Plating  . arm surgery     at age 44;broke arm and had to go to the OR  . [place]  . COLONOSCOPY    . FINGER SURGERY  2012   right thumb   . SHOULDER SURGERY  04/12/2015    Social History   Tobacco Use  . Smoking status: Former Smoker    Packs/day: 0.25    Years: 15.00    Pack years: 3.75  . Smokeless tobacco: Never Used  . Tobacco comment: quit 44yrs ago  Substance Use Topics  . Alcohol use: No  . Drug use: No    Family History  Problem Relation Age of Onset  . Anesthesia  problems Neg Hx   . Hypotension Neg Hx   . Malignant hyperthermia Neg Hx   . Pseudochol deficiency Neg Hx   . Heart disease Neg Hx     Allergies  Allergen Reactions  . Ciprofloxacin Hcl   . Niacin And Related Other (See Comments)    Blood pressure drops and passes out  . Penicillins Hives  . Sulfa Antibiotics Other (See Comments)    Katherina Right Syndrome    Medication list has been reviewed and updated.  Current Outpatient Medications on File Prior to Visit  Medication Sig Dispense Refill  . glucose blood (FREESTYLE LITE) test strip Use as instructed 50 each 3  . ibuprofen (ADVIL,MOTRIN) 800 MG tablet Take 0.5 tablets (400 mg total) by mouth every 8 (eight) hours as needed. 180 tablet 0  . pantoprazole (PROTONIX) 40 MG tablet Take 1 tablet (40 mg total) by mouth daily. 360 tablet 0  .  spironolactone (ALDACTONE) 25 MG tablet Take 1 tablet (25 mg total) by mouth daily. 30 tablet 2   No current facility-administered medications on file prior to visit.     Review of Systems:  As per HPI- otherwise negative.  Wt Readings from Last 3 Encounters:  10/24/18 208 lb (94.3 kg)  10/04/18 208 lb (94.3 kg)  12/20/17 209 lb (94.8 kg)   No fever or chills Weight is stable   Physical Examination: Vitals:   10/24/18 0820  BP: 122/80  Pulse: 78  Resp: 16  Temp: (!) 96.9 F (36.1 C)  SpO2: 98%   Vitals:   10/24/18 0820  Weight: 208 lb (94.3 kg)  Height: 6\' 1"  (1.854 m)   Body mass index is 27.44 kg/m. Ideal Body Weight: Weight in (lb) to have BMI = 25: 189.1  GEN: WDWN, NAD, Non-toxic, A & O x 3, looks well, quite tan  HEENT: Atraumatic, Normocephalic. Neck supple. No masses, No LAD.  Wears hearing aids  Ears and Nose: No external deformity. CV: RRR, No M/G/R. No JVD. No thrill. No extra heart sounds. PULM: CTA B, no wheezes, crackles, rhonchi. No retractions. No resp. distress. No accessory muscle use. ABD: S, NT, ND, +BS. No rebound. No HSM.  Ventral hernia, non tender EXTR: No c/c/e NEURO Normal gait.  PSYCH: Normally interactive. Conversant. Not depressed or anxious appearing.  Calm demeanor.    Assessment and Plan:   ICD-10-CM   1. Needs flu shot  Z23 Flu Vaccine QUAD High Dose(Fluad)  2. Screening for colon cancer  Z12.11   3. Dyslipidemia  E78.5   4. Diabetes mellitus without complication ([place])  [id number]   5. Ventral hernia without obstruction or gangrene  K43.9    Following up today DM well controlled with diet  Flu shot given cologuard ordered Recommend shingrix Discussed ventral hernia- not symptomatic, observe.   Continue to be physically active He has a dermatologist and saw her just recently; he does get a lot of sun  Follow-up: No follow-ups on file.  No orders of the defined types were placed in this encounter.  Orders Placed This  Encounter  Procedures  . Flu Vaccine QUAD High Dose(Fluad)    @SIGN @    Signed Lamar Blinks, MD

## 2018-10-22 NOTE — Patient Instructions (Addendum)
It was good to see you today Please remember your annual eye exam It looks like your colon cancer screening is due this year- we ordered the cologuard test so you can screen at home  I would also suggest that you get the shingles vaccine, called Shingrix, at your drugstore  You got your flu shot today  Please see me in about 6 months for a recheck and take care

## 2018-10-24 ENCOUNTER — Other Ambulatory Visit: Payer: Self-pay

## 2018-10-24 ENCOUNTER — Encounter: Payer: Self-pay | Admitting: Family Medicine

## 2018-10-24 ENCOUNTER — Ambulatory Visit (INDEPENDENT_AMBULATORY_CARE_PROVIDER_SITE_OTHER): Payer: Medicare Other | Admitting: Family Medicine

## 2018-10-24 VITALS — BP 122/80 | HR 78 | Temp 96.9°F | Resp 16 | Ht 73.0 in | Wt 208.0 lb

## 2018-10-24 DIAGNOSIS — K439 Ventral hernia without obstruction or gangrene: Secondary | ICD-10-CM

## 2018-10-24 DIAGNOSIS — E785 Hyperlipidemia, unspecified: Secondary | ICD-10-CM | POA: Diagnosis not present

## 2018-10-24 DIAGNOSIS — E119 Type 2 diabetes mellitus without complications: Secondary | ICD-10-CM | POA: Diagnosis not present

## 2018-10-24 DIAGNOSIS — Z1211 Encounter for screening for malignant neoplasm of colon: Secondary | ICD-10-CM

## 2018-10-24 DIAGNOSIS — Z23 Encounter for immunization: Secondary | ICD-10-CM | POA: Diagnosis not present

## 2018-11-06 DIAGNOSIS — Z1212 Encounter for screening for malignant neoplasm of rectum: Secondary | ICD-10-CM | POA: Diagnosis not present

## 2018-11-06 DIAGNOSIS — Z1211 Encounter for screening for malignant neoplasm of colon: Secondary | ICD-10-CM | POA: Diagnosis not present

## 2018-11-12 LAB — COLOGUARD: Cologuard: NEGATIVE

## 2018-11-13 ENCOUNTER — Encounter: Payer: Self-pay | Admitting: Family Medicine

## 2019-01-13 ENCOUNTER — Telehealth: Payer: Self-pay | Admitting: Family Medicine

## 2019-01-13 DIAGNOSIS — Z03818 Encounter for observation for suspected exposure to other biological agents ruled out: Secondary | ICD-10-CM | POA: Diagnosis not present

## 2019-01-13 NOTE — Telephone Encounter (Signed)
Patient states he changed his mind and went to [place] anyway to have test done. Patient is being tested now.

## 2019-01-13 NOTE — Telephone Encounter (Signed)
Pt was exposed to [place] by a friend while playing golf.  Pt wants to be tested at [facility] where he can get results back in 15 minutes.  Pt states that because he has Medicaid he must have a referral by his PCP to get this done. Pt can be reached at [phone]

## 2019-01-13 NOTE — Telephone Encounter (Signed)
Ok- please give him a call.  I am glad to help but need more details about where to send order/ referral TY

## 2019-01-29 ENCOUNTER — Other Ambulatory Visit: Payer: Self-pay | Admitting: Family Medicine

## 2019-01-29 NOTE — Telephone Encounter (Signed)
Medication Refill - Medication:  ibuprofen (ADVIL,MOTRIN) 800 MG tablet [id number]     Preferred Pharmacy (with phone number or street name): [facility] [id number] [place]  [address] [place] [address]  Phone: [phone] Fax: [phone]     Agent: Please be advised that RX refills may take up to 3 business days. We ask that you follow-up with your pharmacy.

## 2019-01-30 MED ORDER — IBUPROFEN 800 MG PO TABS: 400.0000 mg | ORAL_TABLET | Freq: Three times a day (TID) | ORAL | 0 refills | Status: AC | PRN

## 2019-01-30 MED ORDER — IBUPROFEN 800 MG PO TABS: 400.0000 mg | ORAL_TABLET | Freq: Three times a day (TID) | ORAL | 0 refills | Status: DC | PRN

## 2019-01-31 DIAGNOSIS — Z880 Allergy status to penicillin: Secondary | ICD-10-CM | POA: Diagnosis not present

## 2019-01-31 DIAGNOSIS — Z882 Allergy status to sulfonamides status: Secondary | ICD-10-CM | POA: Diagnosis not present

## 2019-01-31 DIAGNOSIS — K219 Gastro-esophageal reflux disease without esophagitis: Secondary | ICD-10-CM | POA: Diagnosis not present

## 2019-01-31 DIAGNOSIS — R1030 Lower abdominal pain, unspecified: Secondary | ICD-10-CM | POA: Diagnosis not present

## 2019-01-31 DIAGNOSIS — K5900 Constipation, unspecified: Secondary | ICD-10-CM | POA: Diagnosis not present

## 2019-01-31 DIAGNOSIS — M1990 Unspecified osteoarthritis, unspecified site: Secondary | ICD-10-CM | POA: Diagnosis not present

## 2019-01-31 DIAGNOSIS — Z87891 Personal history of nicotine dependence: Secondary | ICD-10-CM | POA: Diagnosis not present

## 2019-01-31 DIAGNOSIS — Z86010 Personal history of colonic polyps: Secondary | ICD-10-CM | POA: Diagnosis not present

## 2019-01-31 DIAGNOSIS — Z79899 Other long term (current) drug therapy: Secondary | ICD-10-CM | POA: Diagnosis not present

## 2019-01-31 DIAGNOSIS — Z7982 Long term (current) use of aspirin: Secondary | ICD-10-CM | POA: Diagnosis not present

## 2019-01-31 DIAGNOSIS — Z888 Allergy status to other drugs, medicaments and biological substances status: Secondary | ICD-10-CM | POA: Diagnosis not present

## 2019-02-06 DIAGNOSIS — L570 Actinic keratosis: Secondary | ICD-10-CM | POA: Diagnosis not present

## 2019-02-06 DIAGNOSIS — D225 Melanocytic nevi of trunk: Secondary | ICD-10-CM | POA: Diagnosis not present

## 2019-02-06 DIAGNOSIS — L821 Other seborrheic keratosis: Secondary | ICD-10-CM | POA: Diagnosis not present

## 2019-02-06 DIAGNOSIS — L814 Other melanin hyperpigmentation: Secondary | ICD-10-CM | POA: Diagnosis not present

## 2019-02-10 ENCOUNTER — Other Ambulatory Visit: Payer: Self-pay | Admitting: Family Medicine

## 2019-02-10 MED ORDER — PANTOPRAZOLE SODIUM 40 MG PO TBEC: 40.0000 mg | DELAYED_RELEASE_TABLET | Freq: Every day | ORAL | 3 refills | Status: AC

## 2019-02-10 NOTE — Telephone Encounter (Signed)
pantoprazole (PROTONIX) 40 MG tablet  [id number] - [place], [place] - [place] Phone:  [phone]  Fax:  [phone]     Pt says that he always gets a 1 yr refill

## 2019-02-10 NOTE — Telephone Encounter (Signed)
Requested medication (s) are due for refill today: yes  Requested medication (s) are on the active medication list: yes  Last refill: 01/07/2018  Future visit scheduled: yes  Notes to clinic:  requesting a year supply   Requested Prescriptions  Pending Prescriptions Disp Refills   pantoprazole (PROTONIX) 40 MG tablet 360 tablet 0    Sig: Take 1 tablet (40 mg total) by mouth daily.      Gastroenterology: Proton Pump Inhibitors Passed - 02/10/2019 12:25 PM      Passed - Valid encounter within last 12 months    Recent Outpatient Visits           3 months ago Needs flu shot   [facility] [place], MD   4 months ago Left foot pain   [facility], DO   7 months ago Right leg pain   [facility], Gay Filler, MD   1 year ago Diabetes mellitus without complication ([place])   [facility] [place], Gay Filler, MD   1 year ago Benign prostatic hyperplasia, unspecified whether lower urinary tract symptoms present   [facility], Gay Filler, MD       Future Appointments             In 2 months [place], Gay Filler, MD [facility] [place]

## 2019-04-11 ENCOUNTER — Ambulatory Visit: Payer: Medicare Other

## 2019-04-12 ENCOUNTER — Ambulatory Visit: Payer: Medicare Other

## 2019-04-13 ENCOUNTER — Ambulatory Visit: Payer: Medicare Other | Attending: Internal Medicine

## 2019-04-13 DIAGNOSIS — Z23 Encounter for immunization: Secondary | ICD-10-CM | POA: Insufficient documentation

## 2019-04-13 NOTE — Progress Notes (Signed)
   Covid-19 Vaccination Clinic  Name:  Randy Jensen    MRN: [id number] DOB: 1945/03/26  04/13/2019  Mr. Randy Jensen was observed post Covid-19 immunization for 15 minutes without incidence. He was provided with Vaccine Information Sheet and instruction to access the V-Safe system.   Mr. Randy Jensen was instructed to call 911 with any severe reactions post vaccine: Marland Kitchen Difficulty breathing  . Swelling of your face and throat  . A fast heartbeat  . A bad rash all over your body  . Dizziness and weakness    Immunizations Administered    Name Date Dose VIS Date Route   Pfizer COVID-19 Vaccine 04/13/2019  1:10 PM 0.3 mL 02/07/2019 Intramuscular   Manufacturer: [place]   Lot: [id number]   [place]: [id number]

## 2019-04-18 NOTE — Progress Notes (Deleted)
Subjective:   Randy Jensen is a 74 y.o. male who presents for an Initial Medicare Annual Wellness Visit.  Review of Systems  Home Safety/Smoke Alarms: Feels safe in home. Smoke alarms in place.    Male:   CCS- Cologuard 11/12/18-negative PSA-  Lab Results  Component Value Date   PSA 1.11 07/02/2018   PSA 1.11 06/18/2017   PSA 0.98 07/28/2016      Objective:    There were no vitals filed for this visit. There is no height or weight on file to calculate BMI.  Advanced Directives 12/18/2017 02/10/2011  Does Patient Have a Medical Advance Directive? Yes Patient does not have advance directive;Patient would not like information  Type of Scientist, forensic Power of [place];Living will -  Pre-existing out of facility DNR order (yellow form or pink MOST form) - No    Current Medications (verified) Outpatient Encounter Medications as of 04/21/2019  Medication Sig  . glucose blood (FREESTYLE LITE) test strip Use as instructed  . ibuprofen (ADVIL) 800 MG tablet Take 0.5 tablets (400 mg total) by mouth every 8 (eight) hours as needed. Use sparingly  . pantoprazole (PROTONIX) 40 MG tablet Take 1 tablet (40 mg total) by mouth daily.  Marland Kitchen spironolactone (ALDACTONE) 25 MG tablet Take 1 tablet (25 mg total) by mouth daily.   No facility-administered encounter medications on file as of 04/21/2019.    Allergies (verified) Ciprofloxacin hcl, Niacin and related, Penicillins, and Sulfa antibiotics   History: Past Medical History:  Diagnosis Date  . Arthritis   . Blood in urine    occ trace per pt   . Cataracts, bilateral   . Cervical spinal stenosis   . Diabetes mellitus    "borderline" doesn't require meds;diet and exercise;last  A1 C 6.2 a month ago  . Hiatal hernia    pt states that he takes Pantoprazole daily for hiatal hernia but denies GERD  . Hx of colonic polyps    Past Surgical History:  Procedure Laterality Date  . ANTERIOR CERVICAL DECOMP/DISCECTOMY FUSION   02/16/2011   Procedure: ANTERIOR CERVICAL DECOMPRESSION/DISCECTOMY FUSION 3 LEVELS;  Surgeon: Eustace Moore;  Location: [facility];  Service: Neurosurgery;  Laterality: N/A;  Cervical Three-four,Cervical Four-Five,Cervical Five-Six,Anterior Cervical Decompression and Fusion with Nuvasive Plating  . arm surgery     at age 73;broke arm and had to go to the OR  . [place]  . COLONOSCOPY    . FINGER SURGERY  2012   right thumb   . SHOULDER SURGERY  04/12/2015   Family History  Problem Relation Age of Onset  . Anesthesia problems Neg Hx   . Hypotension Neg Hx   . Malignant hyperthermia Neg Hx   . Pseudochol deficiency Neg Hx   . Heart disease Neg Hx    Social History   Socioeconomic History  . Marital status: Married    Spouse name: Not on file  . Number of children: Not on file  . Years of education: Not on file  . Highest education level: Not on file  Occupational History  . Not on file  Tobacco Use  . Smoking status: Former Smoker    Packs/day: 0.25    Years: 15.00    Pack years: 3.75  . Smokeless tobacco: Never Used  . Tobacco comment: quit 70yrs ago  Substance and Sexual Activity  . Alcohol use: No  . Drug use: No  . Sexual activity: Yes  Other Topics Concern  .  Not on file  Social History Narrative  . Not on file   Social Determinants of Health   Financial Resource Strain:   . Difficulty of Paying Living Expenses: Not on file  Food Insecurity:   . Worried About Charity fundraiser in the Last Year: Not on file  . Ran Out of Food in the Last Year: Not on file  Transportation Needs:   . Lack of Transportation (Medical): Not on file  . Lack of Transportation (Non-Medical): Not on file  Physical Activity:   . Days of Exercise per Week: Not on file  . Minutes of Exercise per Session: Not on file  Stress:   . Feeling of Stress : Not on file  Social Connections:   . Frequency of Communication with Friends and Family: Not on file  . Frequency of Social  Gatherings with Friends and Family: Not on file  . Attends Religious Services: Not on file  . Active Member of Clubs or Organizations: Not on file  . Attends Archivist Meetings: Not on file  . Marital Status: Not on file   Tobacco Counseling Counseling given: Not Answered Comment: quit 53yrs ago   Clinical Intake:                       Activities of Daily Living No flowsheet data found.   Immunizations and Health Maintenance Immunization History  Administered Date(s) Administered  . Fluad Quad(high Dose 65+) 10/24/2018  . Influenza, High Dose Seasonal PF 01/05/2017  . Influenza, Seasonal, Injecte, Preservative Fre 12/17/2015  . PFIZER SARS-COV-2 Vaccination 04/13/2019  . Pneumococcal Conjugate-13 07/26/2016  . Pneumococcal Polysaccharide-23 01/24/2011  . Tdap 10/16/2010, 12/18/2017   Health Maintenance Due  Topic Date Due  . URINE MICROALBUMIN  12/21/2018  . HEMOGLOBIN A1C  04/06/2019    Patient Care Team: Copland, Gay Filler, MD as PCP - General (Family Medicine) Rivka Spring, MD as Consulting Physician (Urology)  Indicate any recent Medical Services you may have received from other than [facility] providers in the past year (date may be approximate).    Assessment:   This is a routine wellness examination for [employer]. Physical assessment deferred to PCP.  Hearing/Vision screen No exam data present  Dietary issues and exercise activities discussed:   Diet (meal preparation, eat out, water intake, caffeinated beverages, dairy products, fruits and vegetables): {Desc; diets:16563} Breakfast: Lunch:  Dinner:      Goals   None    Depression Screen No flowsheet data found.  Fall Risk Fall Risk  10/01/2017  Falls in the past year? No  Comment Emmi Telephone Survey: data to providers prior to load   Cognitive Function: Ad8 score reviewed for issues:  Issues making decisions:  Less interest in hobbies / activities:  Repeats questions,  stories (family complaining):  Trouble using ordinary gadgets (microwave, computer, phone):  Forgets the month or year:   Mismanaging finances:   Remembering appts:  Daily problems with thinking and/or memory: Ad8 score is=         Screening Tests Health Maintenance  Topic Date Due  . URINE MICROALBUMIN  12/21/2018  . HEMOGLOBIN A1C  04/06/2019  . OPHTHALMOLOGY EXAM  08/28/2019  . FOOT EXAM  10/24/2019  . Fecal DNA (Cologuard)  11/11/2021  . TETANUS/TDAP  12/19/2027  . INFLUENZA VACCINE  Completed  . Hepatitis C Screening  Completed  . PNA vac Low Risk Adult  Completed        Plan:   ***  I have personally reviewed and noted the following in the patient's chart:   . Medical and social history . Use of alcohol, tobacco or illicit drugs  . Current medications and supplements . Functional ability and status . Nutritional status . Physical activity . Advanced directives . List of other physicians . Hospitalizations, surgeries, and ER visits in previous 12 months . Vitals . Screenings to include cognitive, depression, and falls . Referrals and appointments  In addition, I have reviewed and discussed with patient certain preventive protocols, quality metrics, and best practice recommendations. A written personalized care plan for preventive services as well as general preventive health recommendations were provided to patient.     Shela Nevin, [place]   04/18/2019

## 2019-04-21 ENCOUNTER — Ambulatory Visit: Payer: Medicare Other | Admitting: *Deleted

## 2019-04-21 ENCOUNTER — Telehealth: Payer: Self-pay

## 2019-04-21 NOTE — Telephone Encounter (Signed)
Caller states received a call from the office this afternoon, appt is on Feb 25th that he is needing to cance

## 2019-04-22 NOTE — Telephone Encounter (Signed)
Patient's appointment has been canceled

## 2019-04-23 ENCOUNTER — Other Ambulatory Visit: Payer: Self-pay

## 2019-04-23 NOTE — Patient Instructions (Addendum)
It was good to see you again today, I will be in touch with your labs as soon as possible  Please go to lab for blood and urine I will refer you to GI to evaluate your change in bowel habits  Please consider getting the Shingrix vaccine at the pharmacy once you have completed your covid series   Please see me in 6 months assuming all is well

## 2019-04-23 NOTE — Progress Notes (Addendum)
Randy Jensen at [facility] [address] [place], [place] [address] [phone]  Date:  04/24/2019   Name:  Randy Jensen   DOB:  Jul 21, 1945   MRN:  [id number]  PCP:  Darreld Mclean, MD    Chief Complaint: Diabetes (A1C check) and Constipation (hospital, had to get enema, using miralax but not working)   History of Present Illness:  Randy Jensen is a 74 y.o. very pleasant male patient who presents with the following:  Patient with history of diet-controlled diabetes, hypertension, edema, cervical stenosis status post discectomy and fusion 2012.  Here today for a periodic follow-up visit Last seen by myself in August 2020 He was seen in the ER at [facility] in December with constipation He continues to have issues with constipation- this started maybe a year ago, previously he did not have constipation problems.   He is now using miralax and stool softeners this helps some  Last colonoscopy was about a decade ago No blood in his stool He continues to have to strain to pass his bowels Stool volume and stool caliper seems less than normal for him Negative cologuard last year   He is not taking any iron or other supplements He uses protonix prn - does not have to use daily recently  He tries to walk for exercise-has knee pain after about 2 miles He enjoys golf, also rides an exercise bike  Urine microalbumin is due A1c due today Cologuard up-to-date Shingrix?  He is currently getting his covid series, will do after at pharmacy  His weight is stable  He does have an orthopedist who takes care of his knees at [facility] Readings from Last 3 Encounters:  04/24/19 208 lb (94.3 kg)  10/24/18 208 lb (94.3 kg)  10/04/18 208 lb (94.3 kg)    Patient Active Problem List   Diagnosis Date Noted  . Edema of left lower extremity 10/04/2018  . Diabetes mellitus without complication ([place]) 123XX123  . Essential hypertension  06/20/2017    Past Medical History:  Diagnosis Date  . Arthritis   . Blood in urine    occ trace per pt   . Cataracts, bilateral   . Cervical spinal stenosis   . Diabetes mellitus    "borderline" doesn't require meds;diet and exercise;last  A1 C 6.2 a month ago  . Hiatal hernia    pt states that he takes Pantoprazole daily for hiatal hernia but denies GERD  . Hx of colonic polyps     Past Surgical History:  Procedure Laterality Date  . ANTERIOR CERVICAL DECOMP/DISCECTOMY FUSION  02/16/2011   Procedure: ANTERIOR CERVICAL DECOMPRESSION/DISCECTOMY FUSION 3 LEVELS;  Surgeon: Eustace Moore;  Location: [facility];  Service: Neurosurgery;  Laterality: N/A;  Cervical Three-four,Cervical Four-Five,Cervical Five-Six,Anterior Cervical Decompression and Fusion with Nuvasive Plating  . arm surgery     at age 2;broke arm and had to go to the OR  . [place]  . COLONOSCOPY    . FINGER SURGERY  2012   right thumb   . SHOULDER SURGERY  04/12/2015    Social History   Tobacco Use  . Smoking status: Former Smoker    Packs/day: 0.25    Years: 15.00    Pack years: 3.75  . Smokeless tobacco: Never Used  . Tobacco comment: quit 39yrs ago  Substance Use Topics  . Alcohol use: No  . Drug use: No  Family History  Problem Relation Age of Onset  . Anesthesia problems Neg Hx   . Hypotension Neg Hx   . Malignant hyperthermia Neg Hx   . Pseudochol deficiency Neg Hx   . Heart disease Neg Hx     Allergies  Allergen Reactions  . Ciprofloxacin Hcl   . Niacin And Related Other (See Comments)    Blood pressure drops and passes out  . Penicillins Hives  . Sulfa Antibiotics Other (See Comments)    Katherina Right Syndrome    Medication list has been reviewed and updated.  Current Outpatient Medications on File Prior to Visit  Medication Sig Dispense Refill  . glucose blood (FREESTYLE LITE) test strip Use as instructed 50 each 3  . ibuprofen (ADVIL) 800 MG tablet Take 0.5  tablets (400 mg total) by mouth every 8 (eight) hours as needed. Use sparingly 180 tablet 0  . pantoprazole (PROTONIX) 40 MG tablet Take 1 tablet (40 mg total) by mouth daily. 90 tablet 3   No current facility-administered medications on file prior to visit.    Review of Systems:  As per HPI- otherwise negative.   Physical Examination: Vitals:   04/24/19 0827  BP: 140/82  Pulse: (!) 56  Resp: 17  Temp: (!) 96.7 F (35.9 C)  SpO2: 98%   Vitals:   04/24/19 0827  Weight: 208 lb (94.3 kg)  Height: 6\' 1"  (1.854 m)   Body mass index is 27.44 kg/m. Ideal Body Weight: Weight in (lb) to have BMI = 25: 189.1  GEN: no acute distress.  Weight is normal, looks well  HEENT: Atraumatic, Normocephalic.  Ears and Nose: No external deformity. CV: RRR, No M/G/R. No JVD. No thrill. No extra heart sounds. PULM: CTA B, no wheezes, crackles, rhonchi. No retractions. No resp. distress. No accessory muscle use. ABD: S, NT, ND, +BS. No rebound. No HSM.  Belly is benign  EXTR: No c/c/e PSYCH: Normally interactive. Conversant.    Assessment and Plan: Diabetes mellitus without complication ([place]) - Plan: Hemoglobin A1c, Microalbumin / creatinine urine ratio  Dyslipidemia - Plan: Lipid panel  Essential hypertension - Plan: CBC, Comprehensive metabolic panel  Edema of left lower extremity  Benign prostatic hyperplasia, unspecified whether lower urinary tract symptoms present - Plan: PSA  Screening for deficiency anemia - Plan: CBC  Screening for prostate cancer - Plan: PSA  Change in stool habits - Plan: Ambulatory referral to Gastroenterology  Following up today Diet controlled DM- check A1c Lipids, PSA, etc pending Referral to GI for change in bowel habits- may need a scope Follow-up in 6 months Will plan further follow- up pending labs. Moderate medical decision making today   This visit occurred during the SARS-CoV-2 public health emergency.  Safety protocols were in place,  including screening questions prior to the visit, additional usage of staff PPE, and extensive cleaning of exam room while observing appropriate contact time as indicated for disinfecting solutions.    Signed Lamar Blinks, MD  Received his labs 2/26, message to patient  Results for orders placed or performed in visit on 04/24/19  CBC  Result Value Ref Range   WBC 8.1 4.0 - 10.5 K/uL   RBC 5.19 4.22 - 5.81 Mil/uL   Platelets 190.0 150.0 - 400.0 K/uL   Hemoglobin 16.7 13.0 - 17.0 g/dL   HCT 48.6 39.0 - 52.0 %   MCV 93.6 78.0 - 100.0 fl   MCHC 34.4 30.0 - 36.0 g/dL   RDW 13.8 11.5 - 15.5 %  Comprehensive metabolic panel  Result Value Ref Range   Sodium 138 135 - 145 mEq/L   Potassium 4.5 3.5 - 5.1 mEq/L   Chloride 104 96 - 112 mEq/L   CO2 27 19 - 32 mEq/L   Glucose, Bld 119 (H) 70 - 99 mg/dL   BUN 18 6 - 23 mg/dL   Creatinine, Ser 1.10 0.40 - 1.50 mg/dL   Total Bilirubin 1.0 0.2 - 1.2 mg/dL   Alkaline Phosphatase 75 39 - 117 U/L   AST 16 0 - 37 U/L   ALT 11 0 - 53 U/L   Total Protein 7.0 6.0 - 8.3 g/dL   Albumin 4.1 3.5 - 5.2 g/dL   GFR 65.09 >60.00 mL/min   Calcium 9.4 8.4 - 10.5 mg/dL  Hemoglobin A1c  Result Value Ref Range   Hgb A1c MFr Bld 6.9 (H) 4.6 - 6.5 %  Lipid panel  Result Value Ref Range   Cholesterol 171 0 - 200 mg/dL   Triglycerides 136.0 0.0 - 149.0 mg/dL   HDL 27.10 (L) >39.00 mg/dL   VLDL 27.2 0.0 - 40.0 mg/dL   LDL Cholesterol 117 (H) 0 - 99 mg/dL   Total CHOL/HDL Ratio 6    NonHDL 143.84   PSA  Result Value Ref Range   PSA 1.12 0.10 - 4.00 ng/mL  Microalbumin / creatinine urine ratio  Result Value Ref Range   Microalb, Ur 1.7 0.0 - 1.9 mg/dL   Creatinine,U 121.0 mg/dL   Microalb Creat Ratio 1.4 0.0 - 30.0 mg/g

## 2019-04-24 ENCOUNTER — Ambulatory Visit (INDEPENDENT_AMBULATORY_CARE_PROVIDER_SITE_OTHER): Payer: Medicare Other | Admitting: Family Medicine

## 2019-04-24 ENCOUNTER — Other Ambulatory Visit: Payer: Self-pay

## 2019-04-24 ENCOUNTER — Encounter: Payer: Self-pay | Admitting: Family Medicine

## 2019-04-24 ENCOUNTER — Ambulatory Visit: Payer: Medicare Other | Admitting: Family Medicine

## 2019-04-24 VITALS — BP 140/82 | HR 56 | Temp 96.7°F | Resp 17 | Ht 73.0 in | Wt 208.0 lb

## 2019-04-24 DIAGNOSIS — R600 Localized edema: Secondary | ICD-10-CM

## 2019-04-24 DIAGNOSIS — Z130 Encounter for screening for diseases of the blood and blood-forming organs and certain disorders involving the immune mechanism: Secondary | ICD-10-CM

## 2019-04-24 DIAGNOSIS — E119 Type 2 diabetes mellitus without complications: Secondary | ICD-10-CM | POA: Diagnosis not present

## 2019-04-24 DIAGNOSIS — Z125 Encounter for screening for malignant neoplasm of prostate: Secondary | ICD-10-CM

## 2019-04-24 DIAGNOSIS — I10 Essential (primary) hypertension: Secondary | ICD-10-CM

## 2019-04-24 DIAGNOSIS — R194 Change in bowel habit: Secondary | ICD-10-CM

## 2019-04-24 DIAGNOSIS — N400 Enlarged prostate without lower urinary tract symptoms: Secondary | ICD-10-CM | POA: Diagnosis not present

## 2019-04-24 DIAGNOSIS — E785 Hyperlipidemia, unspecified: Secondary | ICD-10-CM

## 2019-04-24 LAB — LIPID PANEL
Cholesterol: 171 mg/dL (ref 0–200)
HDL: 27.1 mg/dL — ABNORMAL LOW (ref >39.00)
LDL Cholesterol: 117 mg/dL — ABNORMAL HIGH (ref 0–99)
NonHDL: 143.84
Total CHOL/HDL Ratio: 6
Triglycerides: 136 mg/dL (ref 0.0–149.0)
VLDL: 27.2 mg/dL (ref 0.0–40.0)

## 2019-04-24 LAB — COMPREHENSIVE METABOLIC PANEL WITH GFR
ALT: 11 U/L (ref 0–53)
AST: 16 U/L (ref 0–37)
Albumin: 4.1 g/dL (ref 3.5–5.2)
Alkaline Phosphatase: 75 U/L (ref 39–117)
BUN: 18 mg/dL (ref 6–23)
CO2: 27 meq/L (ref 19–32)
Calcium: 9.4 mg/dL (ref 8.4–10.5)
Chloride: 104 meq/L (ref 96–112)
Creatinine, Ser: 1.1 mg/dL (ref 0.40–1.50)
GFR: 65.09 mL/min (ref >60.00)
Glucose, Bld: 119 mg/dL — ABNORMAL HIGH (ref 70–99)
Potassium: 4.5 meq/L (ref 3.5–5.1)
Sodium: 138 meq/L (ref 135–145)
Total Bilirubin: 1 mg/dL (ref 0.2–1.2)
Total Protein: 7 g/dL (ref 6.0–8.3)

## 2019-04-24 LAB — CBC
HCT: 48.6 % (ref 39.0–52.0)
Hemoglobin: 16.7 g/dL (ref 13.0–17.0)
MCHC: 34.4 g/dL (ref 30.0–36.0)
MCV: 93.6 fl (ref 78.0–100.0)
Platelets: 190 K/uL (ref 150.0–400.0)
RBC: 5.19 Mil/uL (ref 4.22–5.81)
RDW: 13.8 % (ref 11.5–15.5)
WBC: 8.1 K/uL (ref 4.0–10.5)

## 2019-04-24 LAB — MICROALBUMIN / CREATININE URINE RATIO
Creatinine,U: 121 mg/dL
Microalb Creat Ratio: 1.4 mg/g (ref 0.0–30.0)
Microalb, Ur: 1.7 mg/dL (ref 0.0–1.9)

## 2019-04-24 LAB — PSA: PSA: 1.12 ng/mL (ref 0.10–4.00)

## 2019-04-25 ENCOUNTER — Encounter: Payer: Self-pay | Admitting: Gastroenterology

## 2019-04-25 ENCOUNTER — Encounter: Payer: Self-pay | Admitting: Family Medicine

## 2019-04-25 LAB — HEMOGLOBIN A1C: Hgb A1c MFr Bld: 6.9 % — ABNORMAL HIGH (ref 4.6–6.5)

## 2019-05-06 ENCOUNTER — Ambulatory Visit: Payer: Medicare Other | Attending: Internal Medicine

## 2019-05-06 DIAGNOSIS — Z86010 Personal history of colonic polyps: Secondary | ICD-10-CM | POA: Diagnosis not present

## 2019-05-06 DIAGNOSIS — K5900 Constipation, unspecified: Secondary | ICD-10-CM | POA: Diagnosis not present

## 2019-05-06 DIAGNOSIS — Z23 Encounter for immunization: Secondary | ICD-10-CM

## 2019-05-06 NOTE — Progress Notes (Signed)
   Covid-19 Vaccination Clinic  Name:  Randy Jensen    MRN: [id number] DOB: [birth year]  05/06/2019  Mr. [name] was observed post Covid-19 immunization for 30 minutes based on pre-vaccination screening without incident. He was provided with Vaccine Information Sheet and instruction to access the V-Safe system.   Mr. [name] was instructed to call 911 with any severe reactions post vaccine: Marland Kitchen Difficulty breathing  . Swelling of face and throat  . A fast heartbeat  . A bad rash all over body  . Dizziness and weakness   Immunizations Administered    Name Date Dose VIS Date Route   Pfizer COVID-19 Vaccine 05/06/2019 10:50 AM 0.3 mL 02/07/2019 Intramuscular   Manufacturer: [employer]   Lot: [id number]   [place]: [id number]

## 2019-05-07 DIAGNOSIS — H1132 Conjunctival hemorrhage, left eye: Secondary | ICD-10-CM | POA: Diagnosis not present

## 2019-05-07 DIAGNOSIS — K5900 Constipation, unspecified: Secondary | ICD-10-CM | POA: Diagnosis not present

## 2019-05-27 DIAGNOSIS — H11222 Conjunctival granuloma, left eye: Secondary | ICD-10-CM | POA: Diagnosis not present

## 2019-05-29 ENCOUNTER — Ambulatory Visit: Payer: Medicare Other | Admitting: Gastroenterology

## 2019-06-13 DIAGNOSIS — Z86010 Personal history of colonic polyps: Secondary | ICD-10-CM | POA: Diagnosis not present

## 2019-06-13 DIAGNOSIS — K635 Polyp of colon: Secondary | ICD-10-CM | POA: Diagnosis not present

## 2019-06-13 DIAGNOSIS — D122 Benign neoplasm of ascending colon: Secondary | ICD-10-CM | POA: Diagnosis not present

## 2019-06-13 DIAGNOSIS — D120 Benign neoplasm of cecum: Secondary | ICD-10-CM | POA: Diagnosis not present

## 2019-07-01 DIAGNOSIS — M25561 Pain in right knee: Secondary | ICD-10-CM | POA: Diagnosis not present

## 2019-07-07 ENCOUNTER — Telehealth: Payer: Self-pay

## 2019-07-07 MED ORDER — HYDROCORTISONE (PERIANAL) 2.5 % EX CREA: 1.0000 "application " | TOPICAL_CREAM | Freq: Two times a day (BID) | CUTANEOUS | 0 refills | Status: DC

## 2019-07-07 NOTE — Telephone Encounter (Signed)
Patient called in to see if Dr. Lorelei Pont could send in a prescription in for his  Hemorid's . Please call the patient back at [phone] thanks.

## 2019-07-07 NOTE — Addendum Note (Signed)
Addended by: Lamar Blinks C on: 07/07/2019 04:22 PM   Modules accepted: Orders

## 2019-07-07 NOTE — Telephone Encounter (Signed)
Please give him a call back What has he noticed? Pain? Bleeding?  If so, how much? Has he uses a hemorrhoid med in the past and what was it?   It looks like he is due for colon cancer screening as well.  Would he like me to set this up?

## 2019-07-07 NOTE — Telephone Encounter (Signed)
Patient states he has noticed a hemorrhoid for one week, raised area around rectum, trouble with bowel movement off and on. No blood. He states he was having pain but not recently. Not taking anything other than preparation H and states it its not touching it anymore. Patient had recent colonoscopy 06/13/2019

## 2019-07-07 NOTE — Telephone Encounter (Signed)
Medication not on current list. Please advise.

## 2019-07-08 ENCOUNTER — Telehealth: Payer: Self-pay | Admitting: Family Medicine

## 2019-07-08 MED ORDER — HYDROCORTISONE (PERIANAL) 2.5 % EX CREA: 1.0000 "application " | TOPICAL_CREAM | Freq: Two times a day (BID) | CUTANEOUS | 0 refills | Status: AC

## 2019-07-08 NOTE — Telephone Encounter (Signed)
Rx sent to correct pharmacy.

## 2019-07-08 NOTE — Telephone Encounter (Signed)
hydrocortisone (ANUSOL-HC) 2.5 % rectal cream  Was sent to [facility].... Please resend to [facility].   [place], [place]

## 2019-07-24 DIAGNOSIS — L729 Follicular cyst of the skin and subcutaneous tissue, unspecified: Secondary | ICD-10-CM | POA: Diagnosis not present

## 2019-07-24 DIAGNOSIS — D2270 Melanocytic nevi of unspecified lower limb, including hip: Secondary | ICD-10-CM | POA: Diagnosis not present

## 2019-07-24 DIAGNOSIS — L814 Other melanin hyperpigmentation: Secondary | ICD-10-CM | POA: Diagnosis not present

## 2019-07-24 DIAGNOSIS — D2260 Melanocytic nevi of unspecified upper limb, including shoulder: Secondary | ICD-10-CM | POA: Diagnosis not present

## 2019-07-24 DIAGNOSIS — D225 Melanocytic nevi of trunk: Secondary | ICD-10-CM | POA: Diagnosis not present

## 2019-07-24 DIAGNOSIS — L821 Other seborrheic keratosis: Secondary | ICD-10-CM | POA: Diagnosis not present

## 2019-07-24 DIAGNOSIS — D1801 Hemangioma of skin and subcutaneous tissue: Secondary | ICD-10-CM | POA: Diagnosis not present

## 2019-10-15 DIAGNOSIS — L821 Other seborrheic keratosis: Secondary | ICD-10-CM | POA: Diagnosis not present

## 2019-10-15 DIAGNOSIS — L570 Actinic keratosis: Secondary | ICD-10-CM | POA: Diagnosis not present

## 2019-10-15 DIAGNOSIS — D492 Neoplasm of unspecified behavior of bone, soft tissue, and skin: Secondary | ICD-10-CM | POA: Diagnosis not present

## 2019-10-21 NOTE — Patient Instructions (Addendum)
I will be in touch with your labs Assuming okay, we can plan to visit in 6 months

## 2019-10-21 NOTE — Progress Notes (Addendum)
[place] at [facility], [place], [place] [address] [phone]  Date:  10/23/2019   Name:  Randy Jensen   DOB:  [birth year]   MRN:  [id number]  PCP:  Darreld Mclean, MD    Chief Complaint: Diabetes (a1c check) and Lack of Energy   History of Present Illness:  Randy Jensen is a 74 y.o. very pleasant male patient who presents with the following:  Periodic follow-up visit today Patient with history of hypertension, diabetes, dyslipidemia, edema, cervical stenosis status post his ectomy and fusion 2012 Last seen by myself in February of this year  Today Randy Jensen notes that he gets "worn out" more easily recently Washing his car, mowing the yard makes him feel sore and tired out No CP or SOB, he vehemently denies any symptoms We did a stress in 2017 that was reassuring  Nuclear stress EF: 65%.  There was no ST segment deviation noted during stress.  No T wave inversion was noted during stress.  Defect 1: There is a medium defect of mild severity.  This is a low risk study.   Medium size, mild intensity mostly fixed (SDS 3) inferolateral attenuation artifact. No significant reversible ischemia. LVEF 65% with normal wall motion. This is a low risk study.  He has severe right knee OA which limits his ability to walk somewhat   Eye exam- done in the spring per his report  A1c now due Foot exam due Primary Covid series complete I mentioned a COVID-19 prescription, he states he is not interested  Most recent labs in February, A1c 6.9 With most recent labs I recommend that we start on a cholesterol medication, he has not yet followed up with me regarding this question  Recent colonoscopy performed by Dr. Alvester Chou, [facility] Patient reports she is not interested in cholesterol medication Patient Active Problem List   Diagnosis Date Noted  . Edema of left lower extremity 10/04/2018  . Diabetes mellitus without  complication ([place]) 38/11/1749  . Essential hypertension 06/20/2017    Past Medical History:  Diagnosis Date  . Arthritis   . Blood in urine    occ trace per pt   . Cataracts, bilateral   . Cervical spinal stenosis   . Diabetes mellitus    "borderline" doesn't require meds;diet and exercise;last  A1 C 6.2 a month ago  . Hiatal hernia    pt states that he takes Pantoprazole daily for hiatal hernia but denies GERD  . Hx of colonic polyps     Past Surgical History:  Procedure Laterality Date  . ANTERIOR CERVICAL DECOMP/DISCECTOMY FUSION  02/16/2011   Procedure: ANTERIOR CERVICAL DECOMPRESSION/DISCECTOMY FUSION 3 LEVELS;  Surgeon: Eustace Moore;  Location: [facility];  Service: Neurosurgery;  Laterality: N/A;  Cervical Three-four,Cervical Four-Five,Cervical Five-Six,Anterior Cervical Decompression and Fusion with Nuvasive Plating  . arm surgery     at age 82;broke arm and had to go to the OR  . [place]  . COLONOSCOPY    . FINGER SURGERY  2012   right thumb   . SHOULDER SURGERY  04/12/2015    Social History   Tobacco Use  . Smoking status: Former Smoker    Packs/day: 0.25    Years: 15.00    Pack years: 3.75  . Smokeless tobacco: Never Used  . Tobacco comment: quit 80yrs ago  Substance Use Topics  . Alcohol use: No  . Drug  use: No    Family History  Problem Relation Age of Onset  . Anesthesia problems Neg Hx   . Hypotension Neg Hx   . Malignant hyperthermia Neg Hx   . Pseudochol deficiency Neg Hx   . Heart disease Neg Hx     Allergies  Allergen Reactions  . Ciprofloxacin Hcl   . Niacin And Related Other (See Comments)    Blood pressure drops and passes out  . Penicillins Hives  . Sulfa Antibiotics Other (See Comments)    Katherina Right Syndrome    Medication list has been reviewed and updated.  Current Outpatient Medications on File Prior to Visit  Medication Sig Dispense Refill  . glucose blood (FREESTYLE LITE) test strip Use as instructed 50  each 3  . hydrocortisone (ANUSOL-HC) 2.5 % rectal cream Place 1 application rectally 2 (two) times daily. Use as needed for hemorrhoid- 60 g 0  . ibuprofen (ADVIL) 800 MG tablet Take 0.5 tablets (400 mg total) by mouth every 8 (eight) hours as needed. Use sparingly 180 tablet 0  . pantoprazole (PROTONIX) 40 MG tablet Take 1 tablet (40 mg total) by mouth daily. 90 tablet 3   No current facility-administered medications on file prior to visit.    Review of Systems:  As per HPI- otherwise negative.   Physical Examination: Vitals:   10/23/19 0815  BP: 140/70  Pulse: (!) 59  Resp: 17  SpO2: 98%   Vitals:   10/23/19 0815  Weight: 206 lb (93.4 kg)  Height: 6\' 1"  (1.854 m)   Body mass index is 27.18 kg/m. Ideal Body Weight: Weight in (lb) to have BMI = 25: 189.1  GEN: no acute distress.  Mild overweight, looks well HEENT: Atraumatic, Normocephalic.  Ears and Nose: No external deformity. CV: RRR, No M/G/R. No JVD. No thrill. No extra heart sounds. PULM: CTA B, no wheezes, crackles, rhonchi. No retractions. No resp. distress. No accessory muscle use. ABD: S, NT, ND, +BS. No rebound. No HSM. EXTR: No c/c/e PSYCH: Normally interactive. Conversant.   Assessment and Plan: Diabetes mellitus without complication ([place]) - Plan: Basic metabolic panel, Hemoglobin A1c, glucose blood (FREESTYLE LITE) test strip  Dyslipidemia  Essential hypertension  Edema of left lower extremity - Plan: Basic metabolic panel  Fatigue, unspecified type - Plan: CBC, TSH   Patient today to follow-up diabetes and blood pressure. Blood pressure under okay control Await A1c He notes some vague fatigue, will check CBC and thyroid  Patient argumentative with me today regarding COVID-19 booster, he compares COVID-19 vaccination with the Nazi takeover of [place].  I feel would be in both our best interests for him to find a new primary care provider, and will start discharge proceedings This visit occurred  during the SARS-CoV-2 public health emergency.  Safety protocols were in place, including screening questions prior to the visit, additional usage of staff PPE, and extensive cleaning of exam room while observing appropriate contact time as indicated for disinfecting solutions.    Signed Lamar Blinks, MD  Received labs 8/27- message to pt Results for orders placed or performed in visit on 35/57/32  Basic metabolic panel  Result Value Ref Range   Glucose, Bld 134 (H) 65 - 99 mg/dL   BUN 23 7 - 25 mg/dL   Creat 1.14 0.70 - 1.18 mg/dL   BUN/Creatinine Ratio NOT APPLICABLE 6 - 22 (calc)   Sodium 140 135 - 146 mmol/L   Potassium 4.5 3.5 - 5.3 mmol/L   Chloride 105 98 -  110 mmol/L   CO2 25 20 - 32 mmol/L   Calcium 9.5 8.6 - 10.3 mg/dL  Hemoglobin A1c  Result Value Ref Range   Hgb A1c MFr Bld 6.9 (H) <5.7 % of total Hgb   Mean Plasma Glucose 151 (calc)   eAG (mmol/L) 8.4 (calc)  CBC  Result Value Ref Range   WBC 8.2 3.8 - 10.8 Thousand/uL   RBC 5.35 4.20 - 5.80 Million/uL   Hemoglobin 17.1 13.2 - 17.1 g/dL   HCT 49.9 38 - 50 %   MCV 93.3 80.0 - 100.0 fL   MCH 32.0 27.0 - 33.0 pg   MCHC 34.3 32.0 - 36.0 g/dL   RDW 12.5 11.0 - 15.0 %   Platelets 213 140 - 400 Thousand/uL   MPV 10.6 7.5 - 12.5 fL  TSH  Result Value Ref Range   TSH 2.80 0.40 - 4.50 mIU/L

## 2019-10-23 ENCOUNTER — Encounter: Payer: Self-pay | Admitting: Family Medicine

## 2019-10-23 ENCOUNTER — Ambulatory Visit (INDEPENDENT_AMBULATORY_CARE_PROVIDER_SITE_OTHER): Payer: Medicare Other | Admitting: Family Medicine

## 2019-10-23 ENCOUNTER — Other Ambulatory Visit: Payer: Self-pay

## 2019-10-23 VITALS — BP 140/70 | HR 59 | Resp 17 | Ht 73.0 in | Wt 206.0 lb

## 2019-10-23 DIAGNOSIS — E785 Hyperlipidemia, unspecified: Secondary | ICD-10-CM | POA: Diagnosis not present

## 2019-10-23 DIAGNOSIS — I10 Essential (primary) hypertension: Secondary | ICD-10-CM | POA: Diagnosis not present

## 2019-10-23 DIAGNOSIS — R600 Localized edema: Secondary | ICD-10-CM | POA: Diagnosis not present

## 2019-10-23 DIAGNOSIS — R5383 Other fatigue: Secondary | ICD-10-CM

## 2019-10-23 DIAGNOSIS — E119 Type 2 diabetes mellitus without complications: Secondary | ICD-10-CM | POA: Diagnosis not present

## 2019-10-23 MED ORDER — FREESTYLE LITE TEST VI STRP: ORAL_STRIP | 3 refills | Status: AC

## 2019-10-24 ENCOUNTER — Encounter: Payer: Self-pay | Admitting: Family Medicine

## 2019-10-24 LAB — BASIC METABOLIC PANEL
BUN: 23 mg/dL (ref 7–25)
CO2: 25 mmol/L (ref 20–32)
Calcium: 9.5 mg/dL (ref 8.6–10.3)
Chloride: 105 mmol/L (ref 98–110)
Creat: 1.14 mg/dL (ref 0.70–1.18)
Glucose, Bld: 134 mg/dL — ABNORMAL HIGH (ref 65–99)
Potassium: 4.5 mmol/L (ref 3.5–5.3)
Sodium: 140 mmol/L (ref 135–146)

## 2019-10-24 LAB — CBC
HCT: 49.9 % (ref 38.5–50.0)
Hemoglobin: 17.1 g/dL (ref 13.2–17.1)
MCH: 32 pg (ref 27.0–33.0)
MCHC: 34.3 g/dL (ref 32.0–36.0)
MCV: 93.3 fL (ref 80.0–100.0)
MPV: 10.6 fL (ref 7.5–12.5)
Platelets: 213 10*3/uL (ref 140–400)
RBC: 5.35 10*6/uL (ref 4.20–5.80)
RDW: 12.5 % (ref 11.0–15.0)
WBC: 8.2 10*3/uL (ref 3.8–10.8)

## 2019-10-24 LAB — HEMOGLOBIN A1C
Hgb A1c MFr Bld: 6.9 % of total Hgb — ABNORMAL HIGH (ref <5.7)
Mean Plasma Glucose: 151 (calc)
eAG (mmol/L): 8.4 (calc)

## 2019-10-24 LAB — TSH: TSH: 2.8 mIU/L (ref 0.40–4.50)

## 2019-10-27 ENCOUNTER — Telehealth: Payer: Self-pay | Admitting: Family Medicine

## 2019-10-27 NOTE — Telephone Encounter (Signed)
Will call with results-could you advise on dismissal and what I should inform patient?

## 2019-10-27 NOTE — Telephone Encounter (Signed)
Please give him a call back.  I am sorry he does not understand why I was upset.  He was very argumentative when I recommended he get a booster dose of covid and told me in no uncertain terms that he would not do this, and compared our nation's attempt to contain covid to the Nazi takeover of [place].  I do not feel that I am able to be his primary doctor any longer and respectfully ask him to seek a new doctor  His labs look fine, recent A1c is at goal (blood sugar is under good control). No changes needed at this time.  Please establish care with new doctor in the next 3-6 months  Mel Almond, if you prefer ok to ask Bailey Mech or Maudie Mercury to make this phone call  JC

## 2019-10-27 NOTE — Telephone Encounter (Signed)
Patient called in upset about being dismissed from the practice. Doesn't understand why he was dismissed or what he did to upset Dr. Lorelei Pont. He says he has done everything she's asked and he's been told to talk with his physician about getting a Covid booster and that's what he was under the impression he was doing and is considering it. Says he wanted her opinion and he told her he trusted her.  I told him I would share this with Dr Lorelei Pont. He also called in because even though he can see his lab results in mychart he does not understand them. Please reach out with results

## 2019-10-28 NOTE — Telephone Encounter (Signed)
Patient called back. Spoke with him in full detail regarding his results. He verbalized understanding. We spoke regarding dismissal in regard to advice documented by Dr. Lorelei Pont. He states he will find a new PCP.

## 2019-10-28 NOTE — Telephone Encounter (Signed)
Called pt and LMOVM with his of his results.

## 2019-11-10 ENCOUNTER — Telehealth: Payer: Self-pay | Admitting: Family Medicine

## 2019-11-10 NOTE — Telephone Encounter (Signed)
CallerAbraham Jensen Call Back # [phone]  Patient states he would like a return call from you in reference to letter for dismissal placed in  his mychart. Patient is asking for the letter to be removed.Patient is concerned  that the letter makes him look like crazy.

## 2020-01-15 DIAGNOSIS — E785 Hyperlipidemia, unspecified: Secondary | ICD-10-CM | POA: Diagnosis not present

## 2020-01-15 DIAGNOSIS — Z79899 Other long term (current) drug therapy: Secondary | ICD-10-CM | POA: Diagnosis not present

## 2020-01-15 DIAGNOSIS — E1169 Type 2 diabetes mellitus with other specified complication: Secondary | ICD-10-CM | POA: Diagnosis not present

## 2020-01-15 DIAGNOSIS — Z23 Encounter for immunization: Secondary | ICD-10-CM | POA: Diagnosis not present

## 2020-01-15 DIAGNOSIS — I10 Essential (primary) hypertension: Secondary | ICD-10-CM | POA: Diagnosis not present

## 2020-01-15 DIAGNOSIS — K219 Gastro-esophageal reflux disease without esophagitis: Secondary | ICD-10-CM | POA: Diagnosis not present

## 2020-01-15 DIAGNOSIS — Z5181 Encounter for therapeutic drug level monitoring: Secondary | ICD-10-CM | POA: Diagnosis not present

## 2020-02-04 DIAGNOSIS — Z79899 Other long term (current) drug therapy: Secondary | ICD-10-CM | POA: Diagnosis not present

## 2020-02-04 DIAGNOSIS — E785 Hyperlipidemia, unspecified: Secondary | ICD-10-CM | POA: Diagnosis not present

## 2020-02-04 DIAGNOSIS — E1169 Type 2 diabetes mellitus with other specified complication: Secondary | ICD-10-CM | POA: Diagnosis not present

## 2020-02-04 DIAGNOSIS — Z5181 Encounter for therapeutic drug level monitoring: Secondary | ICD-10-CM | POA: Diagnosis not present

## 2020-02-04 DIAGNOSIS — I10 Essential (primary) hypertension: Secondary | ICD-10-CM | POA: Diagnosis not present

## 2020-03-17 DIAGNOSIS — J988 Other specified respiratory disorders: Secondary | ICD-10-CM | POA: Diagnosis not present

## 2020-03-17 DIAGNOSIS — Z20822 Contact with and (suspected) exposure to covid-19: Secondary | ICD-10-CM | POA: Diagnosis not present

## 2020-03-31 DIAGNOSIS — L821 Other seborrheic keratosis: Secondary | ICD-10-CM | POA: Diagnosis not present

## 2020-03-31 DIAGNOSIS — L309 Dermatitis, unspecified: Secondary | ICD-10-CM | POA: Diagnosis not present

## 2020-05-09 IMAGING — DX LEFT FOOT - COMPLETE 3+ VIEW
3 series · 3 of 3 positions shown · non-contrast
Comparison: No recent.

CLINICAL DATA: Foot pain.  Swelling.  No injury.

EXAM:
LEFT FOOT - COMPLETE 3+ VIEW

[foot ap]
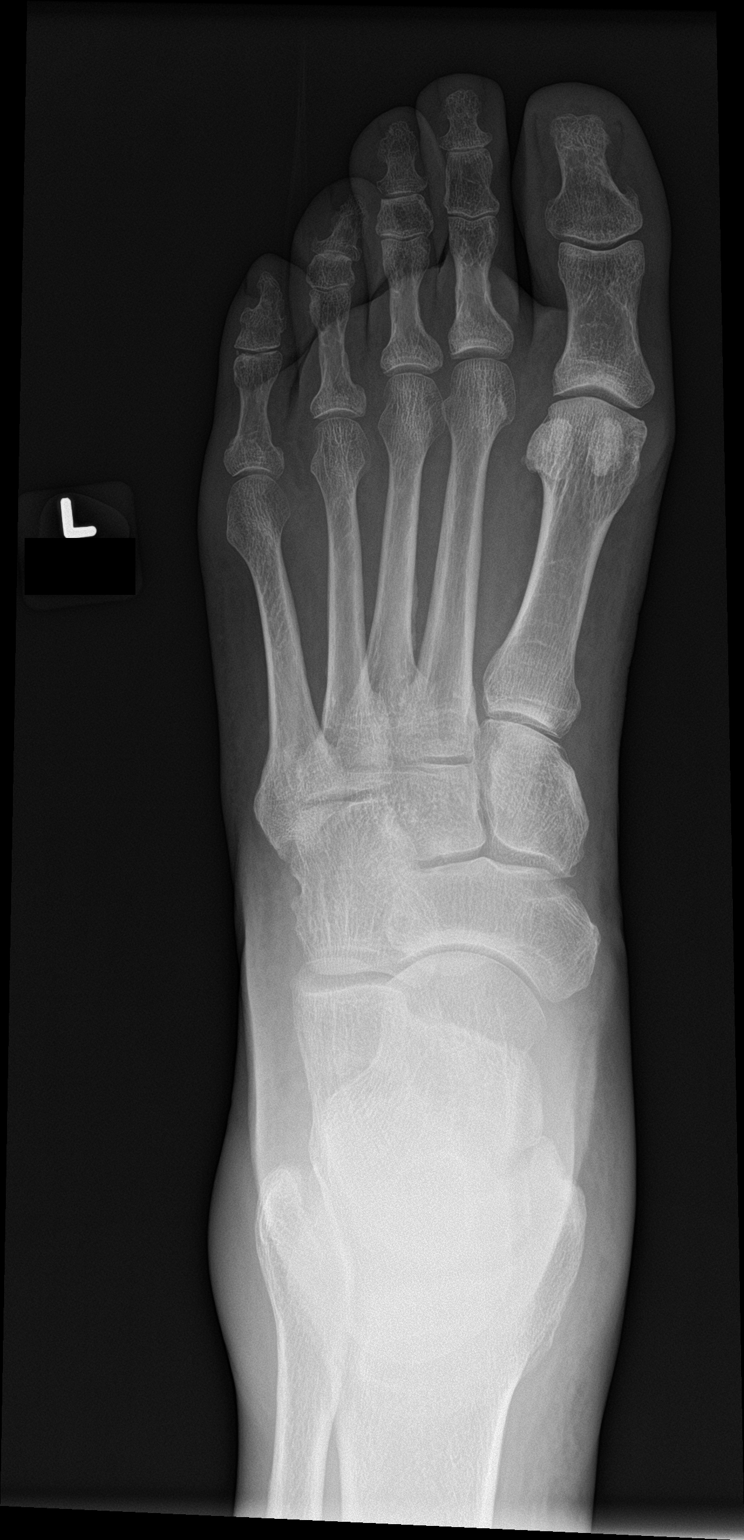

[foot obl]
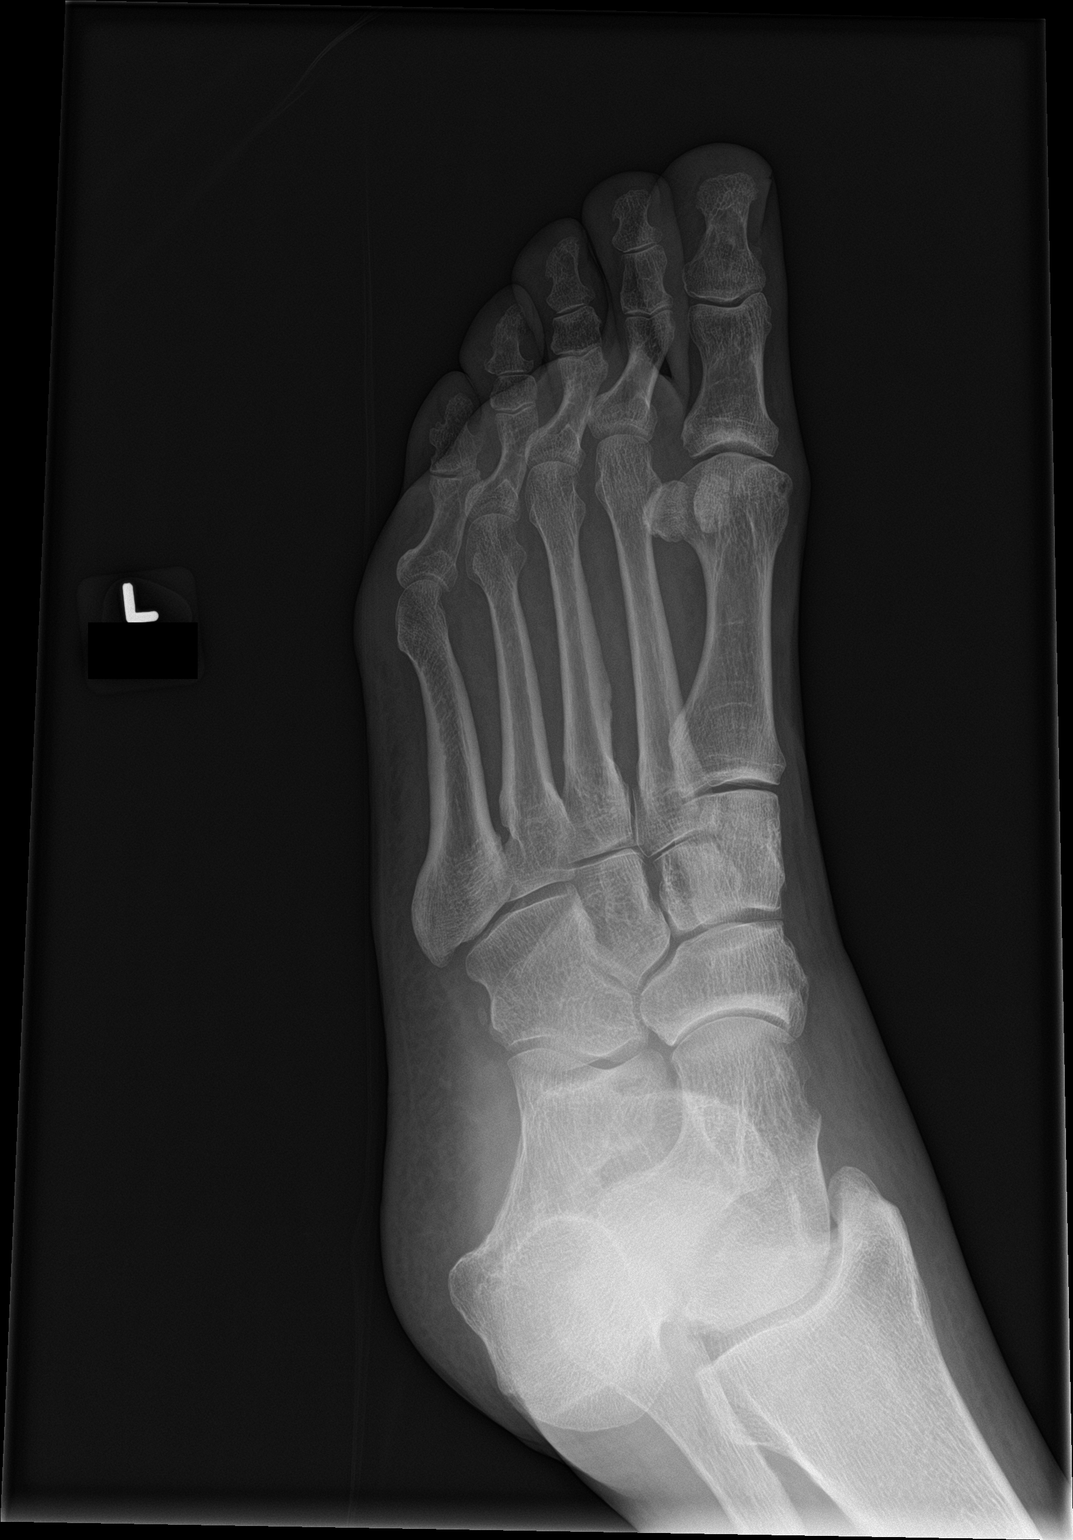

[foot lat]
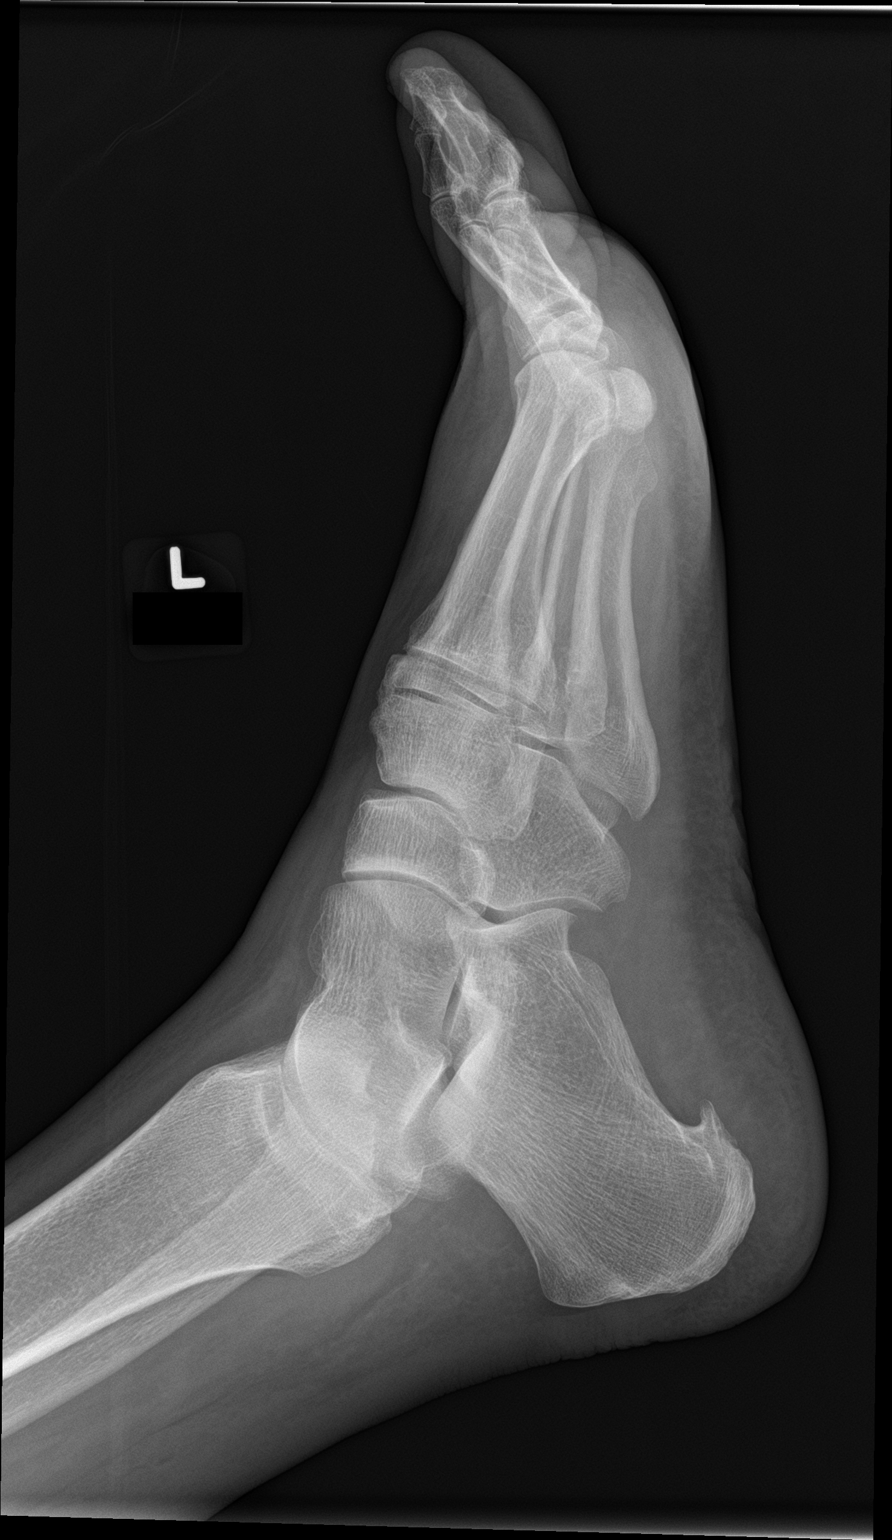

[3 of 3 positions shown; findings below may reference images not displayed]

FINDINGS: No acute bony or joint abnormality identified. No evidence of
fracture or dislocation. Mild degenerative change first MTP joint.
IMPRESSION: No acute abnormality.

## 2020-07-20 DIAGNOSIS — L729 Follicular cyst of the skin and subcutaneous tissue, unspecified: Secondary | ICD-10-CM | POA: Diagnosis not present

## 2020-07-20 DIAGNOSIS — L821 Other seborrheic keratosis: Secondary | ICD-10-CM | POA: Diagnosis not present

## 2020-07-20 DIAGNOSIS — L814 Other melanin hyperpigmentation: Secondary | ICD-10-CM | POA: Diagnosis not present

## 2020-07-20 DIAGNOSIS — L570 Actinic keratosis: Secondary | ICD-10-CM | POA: Diagnosis not present

## 2020-08-03 DIAGNOSIS — R0981 Nasal congestion: Secondary | ICD-10-CM | POA: Diagnosis not present

## 2020-08-03 DIAGNOSIS — E785 Hyperlipidemia, unspecified: Secondary | ICD-10-CM | POA: Diagnosis not present

## 2020-08-03 DIAGNOSIS — E1169 Type 2 diabetes mellitus with other specified complication: Secondary | ICD-10-CM | POA: Diagnosis not present

## 2020-08-03 DIAGNOSIS — T50905A Adverse effect of unspecified drugs, medicaments and biological substances, initial encounter: Secondary | ICD-10-CM | POA: Diagnosis not present

## 2020-08-03 DIAGNOSIS — D519 Vitamin B12 deficiency anemia, unspecified: Secondary | ICD-10-CM | POA: Diagnosis not present

## 2020-08-03 DIAGNOSIS — K648 Other hemorrhoids: Secondary | ICD-10-CM | POA: Diagnosis not present

## 2020-08-03 DIAGNOSIS — I10 Essential (primary) hypertension: Secondary | ICD-10-CM | POA: Diagnosis not present

## 2020-08-03 DIAGNOSIS — R3912 Poor urinary stream: Secondary | ICD-10-CM | POA: Diagnosis not present

## 2020-10-07 DIAGNOSIS — R3912 Poor urinary stream: Secondary | ICD-10-CM | POA: Diagnosis not present

## 2020-10-20 DIAGNOSIS — L299 Pruritus, unspecified: Secondary | ICD-10-CM | POA: Diagnosis not present

## 2020-10-20 DIAGNOSIS — C4442 Squamous cell carcinoma of skin of scalp and neck: Secondary | ICD-10-CM | POA: Diagnosis not present

## 2020-10-20 DIAGNOSIS — L309 Dermatitis, unspecified: Secondary | ICD-10-CM | POA: Diagnosis not present

## 2020-10-20 DIAGNOSIS — D492 Neoplasm of unspecified behavior of bone, soft tissue, and skin: Secondary | ICD-10-CM | POA: Diagnosis not present

## 2020-10-20 DIAGNOSIS — L820 Inflamed seborrheic keratosis: Secondary | ICD-10-CM | POA: Diagnosis not present

## 2020-12-17 DIAGNOSIS — C4442 Squamous cell carcinoma of skin of scalp and neck: Secondary | ICD-10-CM | POA: Diagnosis not present

## 2020-12-27 DIAGNOSIS — Z23 Encounter for immunization: Secondary | ICD-10-CM | POA: Diagnosis not present

## 2021-01-11 DIAGNOSIS — M1711 Unilateral primary osteoarthritis, right knee: Secondary | ICD-10-CM | POA: Diagnosis not present

## 2021-01-11 DIAGNOSIS — M545 Low back pain, unspecified: Secondary | ICD-10-CM | POA: Diagnosis not present

## 2021-01-12 DIAGNOSIS — L821 Other seborrheic keratosis: Secondary | ICD-10-CM | POA: Diagnosis not present

## 2021-01-12 DIAGNOSIS — Z85828 Personal history of other malignant neoplasm of skin: Secondary | ICD-10-CM | POA: Diagnosis not present

## 2021-01-12 DIAGNOSIS — L570 Actinic keratosis: Secondary | ICD-10-CM | POA: Diagnosis not present

## 2021-01-14 DIAGNOSIS — M5136 Other intervertebral disc degeneration, lumbar region: Secondary | ICD-10-CM | POA: Diagnosis not present

## 2021-01-14 DIAGNOSIS — M545 Low back pain, unspecified: Secondary | ICD-10-CM | POA: Diagnosis not present

## 2021-01-14 DIAGNOSIS — M6281 Muscle weakness (generalized): Secondary | ICD-10-CM | POA: Diagnosis not present

## 2021-01-18 DIAGNOSIS — M545 Low back pain, unspecified: Secondary | ICD-10-CM | POA: Diagnosis not present

## 2021-01-18 DIAGNOSIS — M6281 Muscle weakness (generalized): Secondary | ICD-10-CM | POA: Diagnosis not present

## 2021-01-18 DIAGNOSIS — M5136 Other intervertebral disc degeneration, lumbar region: Secondary | ICD-10-CM | POA: Diagnosis not present

## 2021-01-25 DIAGNOSIS — M6281 Muscle weakness (generalized): Secondary | ICD-10-CM | POA: Diagnosis not present

## 2021-01-25 DIAGNOSIS — M5136 Other intervertebral disc degeneration, lumbar region: Secondary | ICD-10-CM | POA: Diagnosis not present

## 2021-01-25 DIAGNOSIS — M545 Low back pain, unspecified: Secondary | ICD-10-CM | POA: Diagnosis not present
